# Patient Record
Sex: Female | Born: 1965 | Race: White | Hispanic: No | Marital: Married | Smoking: Never smoker
Health system: Southern US, Community
[De-identification: ages and names within clinical notes are randomized; demographics above are authoritative.]

## PROBLEM LIST (undated history)

## (undated) DIAGNOSIS — E785 Hyperlipidemia, unspecified: Secondary | ICD-10-CM

## (undated) DIAGNOSIS — K2270 Barrett's esophagus without dysplasia: Secondary | ICD-10-CM

## (undated) DIAGNOSIS — M722 Plantar fascial fibromatosis: Secondary | ICD-10-CM

## (undated) DIAGNOSIS — K219 Gastro-esophageal reflux disease without esophagitis: Secondary | ICD-10-CM

## (undated) DIAGNOSIS — J309 Allergic rhinitis, unspecified: Secondary | ICD-10-CM

## (undated) DIAGNOSIS — E282 Polycystic ovarian syndrome: Secondary | ICD-10-CM

## (undated) DIAGNOSIS — G4730 Sleep apnea, unspecified: Secondary | ICD-10-CM

## (undated) DIAGNOSIS — E559 Vitamin D deficiency, unspecified: Secondary | ICD-10-CM

---

## 1998-07-13 ENCOUNTER — Other Ambulatory Visit: Admission: RE | Admit: 1998-07-13 | Discharge: 1998-07-13 | Payer: Self-pay | Admitting: Gynecology

## 1998-07-19 ENCOUNTER — Other Ambulatory Visit: Admission: RE | Admit: 1998-07-19 | Discharge: 1998-07-19 | Payer: Self-pay | Admitting: Gynecology

## 1998-07-26 ENCOUNTER — Other Ambulatory Visit: Admission: RE | Admit: 1998-07-26 | Discharge: 1998-07-26 | Payer: Self-pay | Admitting: Obstetrics and Gynecology

## 1998-08-11 ENCOUNTER — Ambulatory Visit (HOSPITAL_COMMUNITY): Admission: RE | Admit: 1998-08-11 | Discharge: 1998-08-11 | Payer: Self-pay | Admitting: Obstetrics and Gynecology

## 1999-06-14 ENCOUNTER — Other Ambulatory Visit: Admission: RE | Admit: 1999-06-14 | Discharge: 1999-06-14 | Payer: Self-pay | Admitting: Obstetrics and Gynecology

## 1999-10-13 ENCOUNTER — Other Ambulatory Visit: Admission: RE | Admit: 1999-10-13 | Discharge: 1999-10-13 | Payer: Self-pay | Admitting: Obstetrics and Gynecology

## 2000-01-16 ENCOUNTER — Inpatient Hospital Stay (HOSPITAL_COMMUNITY): Admission: AD | Admit: 2000-01-16 | Discharge: 2000-01-16 | Payer: Self-pay | Admitting: Obstetrics and Gynecology

## 2000-01-16 ENCOUNTER — Inpatient Hospital Stay (HOSPITAL_COMMUNITY): Admission: AD | Admit: 2000-01-16 | Discharge: 2000-01-18 | Payer: Self-pay | Admitting: Obstetrics & Gynecology

## 2000-01-21 ENCOUNTER — Encounter: Admission: RE | Admit: 2000-01-21 | Discharge: 2000-04-20 | Payer: Self-pay | Admitting: Obstetrics and Gynecology

## 2000-03-12 ENCOUNTER — Other Ambulatory Visit: Admission: RE | Admit: 2000-03-12 | Discharge: 2000-03-12 | Payer: Self-pay | Admitting: Obstetrics and Gynecology

## 2000-03-13 ENCOUNTER — Other Ambulatory Visit: Admission: RE | Admit: 2000-03-13 | Discharge: 2000-03-13 | Payer: Self-pay | Admitting: Obstetrics and Gynecology

## 2000-03-13 ENCOUNTER — Encounter (INDEPENDENT_AMBULATORY_CARE_PROVIDER_SITE_OTHER): Payer: Self-pay

## 2000-08-01 ENCOUNTER — Other Ambulatory Visit: Admission: RE | Admit: 2000-08-01 | Discharge: 2000-08-01 | Payer: Self-pay | Admitting: Obstetrics and Gynecology

## 2001-02-20 ENCOUNTER — Other Ambulatory Visit: Admission: RE | Admit: 2001-02-20 | Discharge: 2001-02-20 | Payer: Self-pay | Admitting: Obstetrics and Gynecology

## 2001-10-01 ENCOUNTER — Other Ambulatory Visit: Admission: RE | Admit: 2001-10-01 | Discharge: 2001-10-01 | Payer: Self-pay | Admitting: Obstetrics and Gynecology

## 2002-07-29 ENCOUNTER — Encounter: Payer: Self-pay | Admitting: Obstetrics and Gynecology

## 2002-07-29 ENCOUNTER — Ambulatory Visit (HOSPITAL_COMMUNITY): Admission: RE | Admit: 2002-07-29 | Discharge: 2002-07-29 | Payer: Self-pay | Admitting: Obstetrics and Gynecology

## 2002-12-29 ENCOUNTER — Inpatient Hospital Stay (HOSPITAL_COMMUNITY): Admission: AD | Admit: 2002-12-29 | Discharge: 2002-12-31 | Payer: Self-pay | Admitting: Obstetrics and Gynecology

## 2003-02-18 ENCOUNTER — Other Ambulatory Visit: Admission: RE | Admit: 2003-02-18 | Discharge: 2003-02-18 | Payer: Self-pay | Admitting: *Deleted

## 2004-03-08 ENCOUNTER — Other Ambulatory Visit: Admission: RE | Admit: 2004-03-08 | Discharge: 2004-03-08 | Payer: Self-pay | Admitting: Obstetrics and Gynecology

## 2005-02-07 ENCOUNTER — Ambulatory Visit (HOSPITAL_COMMUNITY): Admission: RE | Admit: 2005-02-07 | Discharge: 2005-02-07 | Payer: Self-pay | Admitting: Obstetrics and Gynecology

## 2005-03-12 ENCOUNTER — Other Ambulatory Visit: Admission: RE | Admit: 2005-03-12 | Discharge: 2005-03-12 | Payer: Self-pay | Admitting: Obstetrics and Gynecology

## 2006-02-12 ENCOUNTER — Ambulatory Visit (HOSPITAL_COMMUNITY): Admission: RE | Admit: 2006-02-12 | Discharge: 2006-02-12 | Payer: Self-pay | Admitting: Obstetrics and Gynecology

## 2006-05-09 ENCOUNTER — Other Ambulatory Visit: Admission: RE | Admit: 2006-05-09 | Discharge: 2006-05-09 | Payer: Self-pay | Admitting: Obstetrics and Gynecology

## 2007-02-18 ENCOUNTER — Ambulatory Visit (HOSPITAL_COMMUNITY): Admission: RE | Admit: 2007-02-18 | Discharge: 2007-02-18 | Payer: Self-pay | Admitting: Obstetrics and Gynecology

## 2007-03-05 ENCOUNTER — Encounter: Admission: RE | Admit: 2007-03-05 | Discharge: 2007-03-05 | Payer: Self-pay | Admitting: Obstetrics and Gynecology

## 2008-03-10 ENCOUNTER — Ambulatory Visit (HOSPITAL_COMMUNITY): Admission: RE | Admit: 2008-03-10 | Discharge: 2008-03-10 | Payer: Self-pay | Admitting: Obstetrics and Gynecology

## 2009-03-11 ENCOUNTER — Ambulatory Visit (HOSPITAL_COMMUNITY): Admission: RE | Admit: 2009-03-11 | Discharge: 2009-03-11 | Payer: Self-pay | Admitting: Obstetrics and Gynecology

## 2010-03-22 ENCOUNTER — Ambulatory Visit (HOSPITAL_COMMUNITY): Admission: RE | Admit: 2010-03-22 | Discharge: 2010-03-22 | Payer: Self-pay | Admitting: Obstetrics and Gynecology

## 2010-03-27 ENCOUNTER — Encounter: Admission: RE | Admit: 2010-03-27 | Discharge: 2010-03-27 | Payer: Self-pay | Admitting: Obstetrics and Gynecology

## 2011-02-27 ENCOUNTER — Other Ambulatory Visit (HOSPITAL_COMMUNITY): Payer: Self-pay | Admitting: Obstetrics and Gynecology

## 2011-02-27 DIAGNOSIS — Z1231 Encounter for screening mammogram for malignant neoplasm of breast: Secondary | ICD-10-CM

## 2011-04-05 ENCOUNTER — Ambulatory Visit (HOSPITAL_COMMUNITY)
Admission: RE | Admit: 2011-04-05 | Discharge: 2011-04-05 | Disposition: A | Discharge status: Home / Self Care | Payer: PRIVATE HEALTH INSURANCE | Source: Ambulatory Visit | Attending: Obstetrics and Gynecology | Admitting: Obstetrics and Gynecology

## 2011-04-05 DIAGNOSIS — Z1231 Encounter for screening mammogram for malignant neoplasm of breast: Secondary | ICD-10-CM | POA: Insufficient documentation

## 2011-04-06 ENCOUNTER — Other Ambulatory Visit: Payer: Self-pay | Admitting: Obstetrics and Gynecology

## 2011-04-06 DIAGNOSIS — R928 Other abnormal and inconclusive findings on diagnostic imaging of breast: Secondary | ICD-10-CM

## 2011-04-12 ENCOUNTER — Ambulatory Visit
Admission: RE | Admit: 2011-04-12 | Discharge: 2011-04-12 | Disposition: A | Discharge status: Home / Self Care | Payer: PRIVATE HEALTH INSURANCE | Source: Ambulatory Visit | Attending: Obstetrics and Gynecology | Admitting: Obstetrics and Gynecology

## 2011-04-12 DIAGNOSIS — R928 Other abnormal and inconclusive findings on diagnostic imaging of breast: Secondary | ICD-10-CM

## 2011-05-11 NOTE — H&P (Signed)
Cynthia Mills, Cynthia Mills                        ACCOUNT NO.:  [id number]   MEDICAL RECORD NO.:  [id number]                   PATIENT TYPE:  INP   LOCATION:  9176                                 FACILITY:  [facility]   PHYSICIAN:  Vicki L. Emilee Hero, C.N.M.             DATE OF BIRTH:  1966-01-17   DATE OF ADMISSION:  12/29/2002  DATE OF DISCHARGE:                                HISTORY & PHYSICAL   HISTORY OF PRESENT ILLNESS:  Cynthia Mills is a 45 year old Gravida III, Para  II, 0/0/2 at 98 and 3/7th weeks who presents from the office with cervix of  6-7 cm in minimal labor. Reports positive fetal movement. She has had  prodromal labor over the last several days. Pregnancy has been remarkable  for advanced maternal age without amnio, history of infertility, history of  positive group B strep with previous pregnancy but negative this pregnancy.   PRENATAL LABORATORY DATA:  Blood type if B+, Rh antibody negative, VDRL non-  reactive. Rubella titer is immune. Hepatitis B surface antigen is negative.  HIV non-reactive. Cystic fibrosis test was negative. GC and Chlamydia  cultures were declined. PAP was normal in October. Glucose challenge was  normal. AFP was normal. Hemoglobin upon entry was 13.3. It was 11.7 at 27  weeks. Estimated date of confinement of December 26, 2002 was established by  last menstrual period and was in agreement with ultrasound at approximately  11 and 18 weeks. Group B strep culture was negative at 36 weeks.   HISTORY OF PRESENT PREGNANCY:  The patient entered care at approximately 9-  10 weeks. She had an early ultrasound for positive viability at  approximately 8 weeks. She declined amnio. She had an ultrasound at 19 weeks  that showed normal growth and development. She had a normal Glucola. The  rest of her pregnancy was essentially uncomplicated.   OBSTETRICAL HISTORY:  In 1997, the patient had a vaginal birth of a female  infant, weighing 7 pounds 3 ounce at 40 weeks. She  was in labor for 20  hours. She had no anesthesia. In January of 2001, she had a vaginal birth of  a female infant weighing 8 pounds and 3 ounces at 40 and 2/7th weeks. She was  in labor five hours. She had no complications. She did have positive beta  strep and was treated.   PAST MEDICAL HISTORY:  She was on oral contraceptives in the past prior to  infertility diagnosis. She stopped in 1995, at which time she began to go  through infertility issues and she has had no contraception since. She was  diagnosed with PCOS. Was treated by Dr. Chevis Pretty. She also had an HSG. She had  occasional yeast infection. She has ascus times two on PAP. In 2001  colposcopy and HPV were negative. She has a history of spastic colon as a  teenager but no problems now. At age 89 years, she had  her bladder  stretched. Her only other hospitalization was for childbirth times two.   ALLERGIES:  No known drug allergies.   FAMILY HISTORY:  Father and maternal grandmother have chronic hypertension.  Her grandmother had some type of problem and had spontaneous miscarriages.  Her grandfather had Alzheimer's. Maternal aunt had lupus and her grandmother  had breast cancer. Genetic history is remarkable only for the patient's age  of 65 to 42.   SOCIAL HISTORY:  The patient is married to the father of the baby. He is  involved and supportive. His name is Emeli Goguen. The patient is Caucasian  and of the Catholic faith. She has been followed by the Midwife Service and  [facility]. She denies any alcohol, drug, or tobacco use during this  pregnancy. 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 Her husband is a Clinical research associate.   PHYSICAL EXAMINATION:  VITAL SIGNS: Stable. Afebrile.  HEENT: Within normal limits.  LUNGS: Clear.  HEART: Regular rate and rhythm. No murmur, rub, or gallop.  BREASTS: Soft and nontender.  ABDOMEN: Fundal height 37 to 38 cm. Estimated fetal weight is 7 to  7.5  pounds. The uterine contractions are irregular and mild. Cervical  examination 6-7 cm, 80% vertex, and minus one station. Fetal heart rate is  reactive on intermittent tracing with no decelerations. Uterine contractions  are currently irregular.  EXTREMITIES: Deep tendon reflexes are 2+ without clonus. There is trace  edema noted.   IMPRESSION:  1. Intrauterine pregnancy at 40 and 3/7th weeks.  2. Advanced cervical change.  3. Post dates.   PLAN:  1. Admit to birthing suite with consult with Dr. Su Hilt, attending     physician.  2. Routine certified nurse midwife orders.  3. The patient declines pain medication. Will ambulate as much as possible.                                               Renaldo Reel Emilee Hero, C.N.M.    VLL/MEDQ  D:  12/29/2002  T:  12/29/2002  Job:  [id number]

## 2012-03-10 ENCOUNTER — Other Ambulatory Visit: Payer: Self-pay | Admitting: Obstetrics and Gynecology

## 2012-03-10 DIAGNOSIS — Z1231 Encounter for screening mammogram for malignant neoplasm of breast: Secondary | ICD-10-CM

## 2012-04-09 ENCOUNTER — Ambulatory Visit (HOSPITAL_COMMUNITY)
Admission: RE | Admit: 2012-04-09 | Discharge: 2012-04-09 | Disposition: A | Discharge status: Home / Self Care | Payer: PRIVATE HEALTH INSURANCE | Source: Ambulatory Visit | Attending: Obstetrics and Gynecology | Admitting: Obstetrics and Gynecology

## 2012-04-09 DIAGNOSIS — Z1231 Encounter for screening mammogram for malignant neoplasm of breast: Secondary | ICD-10-CM | POA: Insufficient documentation

## 2012-05-27 ENCOUNTER — Encounter: Payer: Self-pay | Admitting: Obstetrics and Gynecology

## 2013-03-09 ENCOUNTER — Other Ambulatory Visit: Payer: Self-pay | Admitting: Obstetrics and Gynecology

## 2013-04-20 ENCOUNTER — Ambulatory Visit (HOSPITAL_COMMUNITY)
Admission: RE | Admit: 2013-04-20 | Discharge: 2013-04-20 | Disposition: A | Discharge status: Home / Self Care | Payer: PRIVATE HEALTH INSURANCE | Source: Ambulatory Visit | Attending: Obstetrics and Gynecology | Admitting: Obstetrics and Gynecology

## 2013-04-20 DIAGNOSIS — Z1231 Encounter for screening mammogram for malignant neoplasm of breast: Secondary | ICD-10-CM | POA: Insufficient documentation

## 2014-04-01 ENCOUNTER — Other Ambulatory Visit: Payer: Self-pay | Admitting: Obstetrics and Gynecology

## 2014-04-01 DIAGNOSIS — Z1231 Encounter for screening mammogram for malignant neoplasm of breast: Secondary | ICD-10-CM

## 2014-05-03 ENCOUNTER — Ambulatory Visit (HOSPITAL_COMMUNITY): Payer: PRIVATE HEALTH INSURANCE

## 2014-05-07 ENCOUNTER — Ambulatory Visit (HOSPITAL_COMMUNITY): Payer: PRIVATE HEALTH INSURANCE

## 2014-05-12 ENCOUNTER — Ambulatory Visit (HOSPITAL_COMMUNITY)
Admission: RE | Admit: 2014-05-12 | Discharge: 2014-05-12 | Disposition: A | Discharge status: Home / Self Care | Payer: PRIVATE HEALTH INSURANCE | Source: Ambulatory Visit | Attending: Obstetrics and Gynecology | Admitting: Obstetrics and Gynecology

## 2014-05-12 DIAGNOSIS — Z1231 Encounter for screening mammogram for malignant neoplasm of breast: Secondary | ICD-10-CM | POA: Insufficient documentation

## 2015-03-03 ENCOUNTER — Ambulatory Visit (INDEPENDENT_AMBULATORY_CARE_PROVIDER_SITE_OTHER): Payer: BLUE CROSS/BLUE SHIELD | Admitting: Podiatry

## 2015-03-03 ENCOUNTER — Ambulatory Visit (INDEPENDENT_AMBULATORY_CARE_PROVIDER_SITE_OTHER): Payer: Self-pay

## 2015-03-03 VITALS — BP 119/82 | HR 80 | Resp 12

## 2015-03-03 DIAGNOSIS — R52 Pain, unspecified: Secondary | ICD-10-CM

## 2015-03-03 DIAGNOSIS — M8560 Other cyst of bone, unspecified site: Secondary | ICD-10-CM

## 2015-03-03 DIAGNOSIS — M79673 Pain in unspecified foot: Secondary | ICD-10-CM | POA: Diagnosis not present

## 2015-03-03 DIAGNOSIS — M722 Plantar fascial fibromatosis: Secondary | ICD-10-CM | POA: Diagnosis not present

## 2015-03-03 MED ORDER — MELOXICAM 7.5 MG PO TABS: 7.5000 mg | ORAL_TABLET | Freq: Every day | ORAL | Status: DC

## 2015-03-03 NOTE — Progress Notes (Signed)
Subjective:    Patient ID: Cynthia Mills, female    DOB: 1966-02-04, 49 y.o.   MRN: [id number]  HPI [name] presents the office today with complaints of bilateral heel pain with a right greater than left. She states that initially proximal be 67 years ago she was seen Dr. Paulla Dolly for bilateral heel pain. At that time she had multiple conservative treatments including injections, anti-inflammatories, orthotics, stretching, icing and ultimate she had shockwave therapy. She states that she had relief of symptoms for a couple years however she has started to have reoccurrence of symptoms about 6 months ago. She states the last 3 months has been more significant. She states that she has pain in the mornings or after periods of rest which is relieved by ambulation. She denies any tingling or numbness. The pain does not wake her up at night. Since the reoccurrence she started to stretching, icing, night splint without much resolution. She denies any history of injury or trauma to the area or any change or increase in activity the time of onset of symptoms. Denies any swelling or redness overlying the area. No other complaints at this time.  Review of Systems  All other systems reviewed and are negative.      Objective:   Physical Exam AAO x3, NAD DP/PT pulses palpable bilaterally, CRT less than 3 seconds Protective sensation intact with Simms Weinstein monofilament, vibratory sensation intact, Achilles tendon reflex intact Tenderness to palpation overlying the plantar medial tubercle of the calcaneus to bilateral heels at the insertion of the plantar fascia. The right is worse than the left. There is no pain along the course of plantar fascial within the arch of the foot and the plantar fascia appears intact. There is no pain with lateral compression of the calcaneus or pain the vibratory sensation bilaterally. No pain on the posterior aspect of the calcaneus or along the course/insertion of the  Achilles tendon. There is no overlying edema, erythema, increase in warmth. No other areas of tenderness palpation or pain with vibratory sensation to the foot/ankle bilaterally. MMT 5/5, ROM WNL No open lesions or pre-ulcerative lesions are identified. No pain with calf compression, swelling, warmth, erythema.      Assessment & Plan:  49 year old female with bilateral heel pain, likely plantar fasciitis; calcaneal cyst right side -X-rays were obtained and reviewed with the patient. On the right calcaneus or the large cyst within the calcaneus. There are no old x-rays to compare at this time. We have called to see if we can her her old chart to look at the previous x-rays.  -Discussed likely etiology of her symptoms. I do believe that she has plantar fasciitis symptoms however given the large cyst within the calcaneus on the right side this could also be a result of why the right side is significant more painful than the left. Due to the cyst I recommended an MRI to further evaluate the area. When workup and steroid injections at this time. She has a cam walker at home which she is also use previously for this issue. Diabetic returning her to use this on the right side until we obtain the results of the MRI -For the left discussed a steroid injection however she wishes to hold off at this time as the symptoms are mild. Discussed to continue stretching, icing. Dispensed plantar fascial brace. -Prescribed meloxicam. Discussed side effects the medication directed to stop immediately should any occur and call the office. -Follow-up after MRI. In the meantime, encouraged to  call the office with any questions/concerns/change in symptoms.

## 2015-03-04 NOTE — Addendum Note (Signed)
Addended by: Elta Guadeloupe on: 03/04/2015 08:26 AM   Modules accepted: Orders

## 2015-03-18 ENCOUNTER — Ambulatory Visit: Payer: Self-pay | Admitting: Podiatry

## 2015-03-24 ENCOUNTER — Encounter: Payer: Self-pay | Admitting: Podiatry

## 2015-03-29 ENCOUNTER — Encounter: Payer: Self-pay | Admitting: Podiatry

## 2015-03-29 ENCOUNTER — Ambulatory Visit (INDEPENDENT_AMBULATORY_CARE_PROVIDER_SITE_OTHER): Payer: BLUE CROSS/BLUE SHIELD | Admitting: Podiatry

## 2015-03-29 VITALS — BP 136/87 | HR 78 | Resp 16 | Ht 63.0 in | Wt 220.0 lb

## 2015-03-29 DIAGNOSIS — M8560 Other cyst of bone, unspecified site: Secondary | ICD-10-CM

## 2015-03-29 DIAGNOSIS — M722 Plantar fascial fibromatosis: Secondary | ICD-10-CM

## 2015-03-29 MED ORDER — MELOXICAM 7.5 MG PO TABS: 7.5000 mg | ORAL_TABLET | Freq: Every day | ORAL | Status: DC

## 2015-03-30 NOTE — Progress Notes (Signed)
Patient ID: Cynthia Mills, female   DOB: 11-Aug-1966, 49 y.o.   MRN: [id number]  Subjective: 49 year old female returns the office he discuss MRI results and for continued evaluation of bilateral heel pain. She states that she continues have pain intermittently and she has good days and bad days. She is continuing with the stretching icing activities. She wore the boot intermittently to the right side however she states that she does not like to wear. Denies any recent injury or trauma. No other complaints at this time.  Objective: AAO 3, NAD DP/PT pulses palpable, CRT less than 3 seconds Protective sensation intact with Simms Weinstein monofilament, vibratory sensation intact, Achilles tendon reflex intact. There is tenderness palpation overlying plantar medial tubercle calcaneus at the insertion of the plantar fascia bilaterally with a right greater than left. There is no pain on the course of plantar fascial in the arch of the foot in the ligament appears to be intact. There is no pain with lateral compression of the calcaneus or pain with vibratory sensation. No pain along the posterior aspect of the calcaneus or along the course/insertion the Achilles tendon. No other areas of tenderness to bilateral lower extremities. No overlying edema, erythema, increased warmth to bilateral lower extremities. MMT 5/5, ROM WNL No open lesions or pre-ulcer lesions identified bilaterally. No pain with calf compression, swelling, warmth, erythema  Assessment : 49 year old female with bilateral plantar fasciitis, right interosseous lipoma the calcaneus   Plan : -Treatment options were discussed the patient include alternatives, risks, complications.  -MRI results were discussed the patient.  -At this time patient wishes to hold off on steroid injection. Also due to cortical thinning in the intraosseous lipoma would recommend holding off on steroid injections if possible. -Will proceed with physical  therapy as she has attempted conservative treatment without any resolution other than injection therapy. If a prescription for physical therapy is given to the patient. -Follow-up as her PT evaluation of sooner if any problems are to arise. In the meantime occur should call the office with any questions, concerns, change in symptoms.

## 2015-04-11 ENCOUNTER — Encounter
Admit: 2015-04-11 | Disposition: A | Discharge status: Home / Self Care | Payer: Self-pay | Attending: Podiatry | Admitting: Podiatry

## 2015-04-27 ENCOUNTER — Ambulatory Visit: Payer: BLUE CROSS/BLUE SHIELD | Attending: Podiatry | Admitting: Physical Therapy

## 2015-04-27 ENCOUNTER — Encounter: Payer: Self-pay | Admitting: Physical Therapy

## 2015-04-27 DIAGNOSIS — M25673 Stiffness of unspecified ankle, not elsewhere classified: Secondary | ICD-10-CM | POA: Diagnosis not present

## 2015-04-27 DIAGNOSIS — M25672 Stiffness of left ankle, not elsewhere classified: Secondary | ICD-10-CM

## 2015-04-27 DIAGNOSIS — M722 Plantar fascial fibromatosis: Secondary | ICD-10-CM | POA: Diagnosis present

## 2015-04-27 DIAGNOSIS — R262 Difficulty in walking, not elsewhere classified: Secondary | ICD-10-CM | POA: Diagnosis not present

## 2015-04-27 DIAGNOSIS — M25671 Stiffness of right ankle, not elsewhere classified: Secondary | ICD-10-CM

## 2015-04-27 NOTE — Patient Instructions (Signed)
Pt issued new HEP, see objective.

## 2015-04-27 NOTE — Therapy (Signed)
[facility] [address] [place], [address] Phone: [phone]   Fax:  [phone]  Physical Therapy Treatment  Patient Details  Name: Cynthia Mills MRN: [id number] Date of Birth: [birth year] Referring Provider:  Trula Slade, DPM  Encounter Date: 04/27/2015      PT End of Session - 04/27/15 0832    Visit Number 4   Number of Visits 16   Date for PT Re-Evaluation 06/06/15   PT Start Time 0700   PT Stop Time 0730   PT Time Calculation (min) 30 min   Activity Tolerance No increased pain;Patient tolerated treatment well      History reviewed. No pertinent past medical history.  History reviewed. No pertinent past surgical history.  There were no vitals filed for this visit.  Visit Diagnosis:  Stiffness of ankle joint, left  Stiffness of ankle joint, right      Subjective Assessment - 04/27/15 0700    Subjective Pt reports incr. back stiffness. She used her strassburg sock for the first time this week and it has reduced pain upon waking. Incr. pain at night.    Currently in Pain? No/denies            [facility] PT Assessment - 04/27/15 0001    Assessment   Medical Diagnosis plantar fasciitis   Onset Date 12/24/14   Next MD Visit 06/06/2015   Prior Therapy no   Precautions   Precautions None   Balance Screen   Has the patient fallen in the past 6 months No   Has the patient had a decrease in activity level because of a fear of falling?  No   Is the patient reluctant to leave their home because of a fear of falling?  No      Objective:  Pt performed resisted ankle pumps to progressively load achilles tendon with GTB, 3x10 with knee flexed for soleus, 3x10 with knee straight for gastroc.  Pelvic tilts with cuing to relax back.  Pelvic tilts with heel slides 2x10.  Extensive cuing to be able to relax back, pt made improvement but unable to fully disengage back when  performing.  CPAs grade III 3x1 min L3-L5                       PT Education - 04/27/15 0700    Education provided Yes   Education Details HEP   Person(s) Educated Patient   Methods Explanation;Demonstration   Comprehension Verbalized understanding;Returned demonstration             PT Long Term Goals - 04/27/15 0836    PT LONG TERM GOAL #1   Title Pt will incr. hip extension confirmed by negative Ely test, to aid in incr. step length during ambulation.   Time 8   Period Weeks   Status On-going   PT LONG TERM GOAL #2   Title Pt will report decr. heel pain wiht gait to less than 1/10 to improve functional tolerance.   Time 8   Period Weeks   Status On-going   PT LONG TERM GOAL #3   Title Pt will be I with HEP for self management of ankle symptoms.   Time 8   Period Weeks   Status On-going               Plan - 04/27/15 2993    Clinical Impression Statement Pt appears to have made significant improvement in  acute plantar fasciitis, however back symptoms which are concurrent and appear to be contributing to pain have continued and pt demonstrates very poor compensation strategy using lumbar paraspinasl for core and pelvic floor.   Pt will benefit from skilled therapeutic intervention in order to improve on the following deficits Pain;Improper body mechanics;Postural dysfunction;Decreased strength;Difficulty walking   Rehab Potential Good   PT Frequency 2x / week   PT Duration 8 weeks   PT Treatment/Interventions Manual techniques;Therapeutic exercise;Gait training   PT Next Visit Plan progress to bridging with pelvic tilt, continue to treat plantar fasciitis pain as necessary.   Consulted and Agree with Plan of Care Patient        Problem List There are no active problems to display for this patient.   Fisher,Benjamin PT/DPT 04/27/2015, 8:39 AM  [facility] [address] [place], [address] Phone: [phone]   Fax:  [phone]

## 2015-05-02 ENCOUNTER — Other Ambulatory Visit (HOSPITAL_COMMUNITY): Payer: Self-pay | Admitting: Obstetrics and Gynecology

## 2015-05-02 DIAGNOSIS — Z1231 Encounter for screening mammogram for malignant neoplasm of breast: Secondary | ICD-10-CM

## 2015-05-05 ENCOUNTER — Encounter: Payer: PRIVATE HEALTH INSURANCE | Admitting: Physical Therapy

## 2015-05-05 ENCOUNTER — Ambulatory Visit: Payer: BLUE CROSS/BLUE SHIELD | Admitting: Physical Therapy

## 2015-05-09 ENCOUNTER — Encounter: Payer: Self-pay | Admitting: Physical Therapy

## 2015-05-09 ENCOUNTER — Ambulatory Visit: Payer: BLUE CROSS/BLUE SHIELD | Admitting: Physical Therapy

## 2015-05-09 DIAGNOSIS — M25672 Stiffness of left ankle, not elsewhere classified: Secondary | ICD-10-CM

## 2015-05-09 DIAGNOSIS — M722 Plantar fascial fibromatosis: Secondary | ICD-10-CM | POA: Diagnosis not present

## 2015-05-09 DIAGNOSIS — M25671 Stiffness of right ankle, not elsewhere classified: Secondary | ICD-10-CM

## 2015-05-09 NOTE — Therapy (Signed)
[facility] [address] [place], [address] Phone: [phone]   Fax:  [phone]  Physical Therapy Treatment  Patient Details  Name: Cynthia Mills MRN: [id number] Date of Birth: [birth year] Referring Provider:  Trula Slade, DPM  Encounter Date: 05/09/2015      PT End of Session - 05/09/15 1046    Visit Number 5   Number of Visits 16   Date for PT Re-Evaluation 06/06/15   PT Start Time 1000   PT Stop Time 3810   PT Time Calculation (min) 43 min   Activity Tolerance Patient tolerated treatment well;No increased pain   Behavior During Therapy [facility] for tasks assessed/performed      History reviewed. No pertinent past medical history.  History reviewed. No pertinent past surgical history.  There were no vitals filed for this visit.  Visit Diagnosis:  Stiffness of ankle joint, right  Stiffness of ankle joint, left      Subjective Assessment - 05/09/15 1004    Subjective (p) Pt reports she is having significant generalized pain. Pain in L knee, B ankles, pain in back. Pain is no greater than 3/10.   Currently in Pain? (p) Yes   Pain Score (p) 3    Pain Location (p) Ankle   Pain Orientation (p) Right           Objective:  Deep breathing to produce brace/stomach vacuum with manual palpation of ribs to improve upper abdominal activation.  Same with pelvic tilt 5 min.  Same with pelvic tilt and heel slides 3x5 on each side.  Instruction in self performance of psoas release with tennis ball including locating ASIS and use of wt to improve manual response.   STM performed on R peroneus longus, brevis. Following this pt reported significant improvement in lateral ankle pain, incr. Pain in plantar fascia.  Gastroc STM. No change in plantar fascia.  Psoas release on R following report of line of pain from back to groin to heel. Pt reports decr. Stiffness in back following  psoas release.  Following session reports incr. Ankle pain, decr. Back and knee pain.                          PT Long Term Goals - 04/27/15 0836    PT LONG TERM GOAL #1   Title Pt will incr. hip extension confirmed by negative Ely test, to aid in incr. step length during ambulation.   Time 8   Period Weeks   Status On-going   PT LONG TERM GOAL #2   Title Pt will report decr. heel pain wiht gait to less than 1/10 to improve functional tolerance.   Time 8   Period Weeks   Status On-going   PT LONG TERM GOAL #3   Title Pt will be I with HEP for self management of ankle symptoms.   Time 8   Period Weeks   Status On-going               Plan - 05/09/15 1048    Clinical Impression Statement In session improvement in back stiffness, lateral ankle pain. Incr. pain in plantar fascia following manual tx. Pt demonstrated improved ability to perform trunk control exercises with decr. back activation following cuing and breathing exercise.   Pt will benefit from skilled therapeutic intervention in order to improve on the following deficits Pain;Improper body mechanics;Postural dysfunction;Decreased strength;Difficulty walking  Rehab Potential Good   PT Frequency 2x / week   PT Duration 8 weeks   PT Treatment/Interventions Manual techniques;Therapeutic exercise;Gait training   Consulted and Agree with Plan of Care Patient        Problem List There are no active problems to display for this patient.   Xavier Fournier 05/09/2015, 10:57 AM  [facility] [address], [place], [address] Phone: [phone]   Fax:  [phone]

## 2015-05-11 ENCOUNTER — Ambulatory Visit: Payer: BLUE CROSS/BLUE SHIELD | Admitting: Physical Therapy

## 2015-05-11 DIAGNOSIS — M25671 Stiffness of right ankle, not elsewhere classified: Secondary | ICD-10-CM

## 2015-05-11 DIAGNOSIS — M25672 Stiffness of left ankle, not elsewhere classified: Secondary | ICD-10-CM

## 2015-05-11 DIAGNOSIS — M722 Plantar fascial fibromatosis: Secondary | ICD-10-CM | POA: Diagnosis not present

## 2015-05-11 NOTE — Therapy (Signed)
[facility] [address], [place], [address] Phone: [phone]   Fax:  [phone]  Physical Therapy Treatment  Patient Details  Name: Cynthia Mills MRN: [id number] Date of Birth: [birth year] Referring Provider:  Trula Slade, DPM  Encounter Date: 05/11/2015      PT End of Session - 05/11/15 1201    Visit Number 6   Number of Visits 16   Date for PT Re-Evaluation 06/06/15   PT Start Time 1829   PT Stop Time 1130   PT Time Calculation (min) 45 min   Activity Tolerance Patient tolerated treatment well;No increased pain   Behavior During Therapy [facility] for tasks assessed/performed      No past medical history on file.  No past surgical history on file.  There were no vitals filed for this visit.  Visit Diagnosis:  Stiffness of ankle joint, left  Stiffness of ankle joint, right      Subjective Assessment - 05/11/15 1159    Subjective Pt reports no change since previous session. She has not been doing her exercises as she is feeling down and is having difficulty being motivated.    Currently in Pain? No/denies              Objective: Lunge variations focusing on maintaining knee flexion (avoiding locking knees), with and without turning to focus on avoiding knee valgus to address motor pattern deficit.  3x15 heel raises on stairs.  5x20' narrow BOS walking with heel raises - incr. Knee pain.  Patellar mobs 3x1 min med/lat, 3x1 min inferior glides  3x1 min tib/femur mobs PA grade III  IASTM for B plantar fascia.  Following manual tx pt reported incr. Tenderness in plantar fascia, improved ability to perform BOS walking with decr. Knee pain, and overall decr. Pain with gait.                   PT Education - 05/11/15 1200    Education provided Yes   Education Details Educated pt on postural modification, HEP, maintaining knee flexion with standing tasks.    Person(s) Educated Patient   Methods Explanation;Demonstration   Comprehension Verbalized understanding;Returned demonstration             PT Long Term Goals - 04/27/15 0836    PT LONG TERM GOAL #1   Title Pt will incr. hip extension confirmed by negative Ely test, to aid in incr. step length during ambulation.   Time 8   Period Weeks   Status On-going   PT LONG TERM GOAL #2   Title Pt will report decr. heel pain wiht gait to less than 1/10 to improve functional tolerance.   Time 8   Period Weeks   Status On-going   PT LONG TERM GOAL #3   Title Pt will be I with HEP for self management of ankle symptoms.   Time 8   Period Weeks   Status On-going               Plan - 05/11/15 1202    Clinical Impression Statement Pt had significant improvement in L knee pain with functional activities, decr. back stiffness. Plantar fascia did not hurt but pt reported feeling a thickening of the area. Pt is continuing to have mild to moderate difficulty with B plantar fascia pain in the morning though overlal is doing much better. Continues to have whole body coordination/motor planning deficits.   Pt will benefit  from skilled therapeutic intervention in order to improve on the following deficits Pain;Improper body mechanics;Postural dysfunction;Decreased strength;Difficulty walking   Rehab Potential Good   PT Frequency 2x / week   PT Duration 8 weeks   PT Treatment/Interventions Manual techniques;Therapeutic exercise;Gait training   PT Next Visit Plan progress to bridging with pelvic tilt, continue to treat plantar fasciitis pain as necessary.   Consulted and Agree with Plan of Care Patient        Problem List There are no active problems to display for this patient.   Zymire Turnbo 05/11/2015, 12:05 PM  [facility] [address] [place], [address] Phone: [phone]   Fax:  [phone]

## 2015-05-16 ENCOUNTER — Ambulatory Visit (HOSPITAL_COMMUNITY)
Admission: RE | Admit: 2015-05-16 | Discharge: 2015-05-16 | Disposition: A | Discharge status: Home / Self Care | Payer: BLUE CROSS/BLUE SHIELD | Source: Ambulatory Visit | Attending: Obstetrics and Gynecology | Admitting: Obstetrics and Gynecology

## 2015-05-16 ENCOUNTER — Ambulatory Visit: Payer: BLUE CROSS/BLUE SHIELD | Admitting: Physical Therapy

## 2015-05-16 DIAGNOSIS — M25672 Stiffness of left ankle, not elsewhere classified: Secondary | ICD-10-CM

## 2015-05-16 DIAGNOSIS — Z1231 Encounter for screening mammogram for malignant neoplasm of breast: Secondary | ICD-10-CM | POA: Insufficient documentation

## 2015-05-16 DIAGNOSIS — M722 Plantar fascial fibromatosis: Secondary | ICD-10-CM | POA: Diagnosis not present

## 2015-05-16 DIAGNOSIS — M25671 Stiffness of right ankle, not elsewhere classified: Secondary | ICD-10-CM

## 2015-05-16 NOTE — Therapy (Signed)
[facility] [address] [place], [address] Phone: [phone]   Fax:  [phone]  Physical Therapy Treatment  Patient Details  Name: Cynthia Mills MRN: [id number] Date of Birth: 1966-01-05 Referring Provider:  Trula Slade, DPM  Encounter Date: 05/16/2015      PT End of Session - 05/16/15 1659    Visit Number 7   Number of Visits 16   Date for PT Re-Evaluation 06/06/15   PT Start Time 1600   PT Stop Time 1640   PT Time Calculation (min) 40 min   Activity Tolerance Patient tolerated treatment well;No increased pain   Behavior During Therapy [facility] for tasks assessed/performed      No past medical history on file.  No past surgical history on file.  There were no vitals filed for this visit.  Visit Diagnosis:  Stiffness of ankle joint, left  Stiffness of ankle joint, right      Subjective Assessment - 05/16/15 1659    Subjective Pt reports she feels she has plateau'd. Ankles are "sore" as is back and L knee. NO pain per se currently.   Currently in Pain? No/denies          Objective: Trigger point dry needling performed on B quadratus plantae, flexor digitorum, with piston and twist technique.  IASTM and STM on same muscles, improved tolerance for this following trigger point dry needling.   Reviewed HEP and encouraged pt to focus on plantar loading.                           PT Long Term Goals - 04/27/15 0836    PT LONG TERM GOAL #1   Title Pt will incr. hip extension confirmed by negative Ely test, to aid in incr. step length during ambulation.   Time 8   Period Weeks   Status On-going   PT LONG TERM GOAL #2   Title Pt will report decr. heel pain wiht gait to less than 1/10 to improve functional tolerance.   Time 8   Period Weeks   Status On-going   PT LONG TERM GOAL #3   Title Pt will be I with HEP for self management of ankle symptoms.    Time 8   Period Weeks   Status On-going               Plan - 05/16/15 1700    Clinical Impression Statement focus of session on addressing continued "soreness" in B heels. Pt clarified that when she refers to soreness she is really referring to mild pain so we focused on this. Continues to have mild pain in the mornings, improved from start of care but still serious.   Pt will benefit from skilled therapeutic intervention in order to improve on the following deficits Pain;Improper body mechanics;Postural dysfunction;Decreased strength;Difficulty walking        Problem List There are no active problems to display for this patient.   Fisher,Benjamin 05/16/2015, 5:06 PM  [facility] [address], [place], [address] Phone: [phone]   Fax:  [phone]

## 2015-05-18 ENCOUNTER — Other Ambulatory Visit: Payer: Self-pay | Admitting: Obstetrics and Gynecology

## 2015-05-18 DIAGNOSIS — R928 Other abnormal and inconclusive findings on diagnostic imaging of breast: Secondary | ICD-10-CM

## 2015-05-19 ENCOUNTER — Ambulatory Visit: Payer: BLUE CROSS/BLUE SHIELD | Admitting: Physical Therapy

## 2015-05-19 DIAGNOSIS — M25671 Stiffness of right ankle, not elsewhere classified: Secondary | ICD-10-CM

## 2015-05-19 DIAGNOSIS — M25672 Stiffness of left ankle, not elsewhere classified: Secondary | ICD-10-CM

## 2015-05-19 DIAGNOSIS — M722 Plantar fascial fibromatosis: Secondary | ICD-10-CM | POA: Diagnosis not present

## 2015-05-19 NOTE — Therapy (Signed)
[facility] [address] [place], [address] Phone: [phone]   Fax:  [phone]  Physical Therapy Treatment  Patient Details  Name: Cynthia Mills MRN: [id number] Date of Birth: 11/20/1966 Referring Provider:  Trula Slade, DPM  Encounter Date: 05/19/2015      PT End of Session - 05/19/15 0943    Visit Number 8   Number of Visits 16   Date for PT Re-Evaluation 06/06/15   PT Start Time 0900   PT Stop Time 0942   PT Time Calculation (min) 42 min   Activity Tolerance Patient tolerated treatment well;No increased pain   Behavior During Therapy [facility] for tasks assessed/performed      No past medical history on file.  No past surgical history on file.  There were no vitals filed for this visit.  Visit Diagnosis:  Stiffness of ankle joint, left  Stiffness of ankle joint, right      Subjective Assessment - 05/19/15 0941    Subjective Pt reports no pain today, she is doing much better. Incr. L knee pain when performing her exercises. only pain currently is upon waking.   Currently in Pain? No/denies                Objective: Trigger point dry needling performed on B QP, FDP. On L noted incr. Pain with this, following pt reported no pain with first step. On R incr. "grabbing" of muscle, no change in pain. STM on B medial calcaneal tubercle.   Assessed knee, gentle STM on VMO/adductors.  Toe walking with knees bent to minimize pain in knee. Pt reports improvement in pain with this. Appears to be that pain in knee comes on with high level quad activation possibly compressing patella.  Following treatment today pt reported improvement in first step pain. Verbalized understanding of exercise modifications to minimize knee pain and first step pain.                 PT Education - 05/19/15 0941    Education Details Educated pt on assessing whether exercises are  incr. pain and to d/c exercises when painful.   Person(s) Educated Patient   Methods Explanation   Comprehension Verbalized understanding             PT Long Term Goals - 04/27/15 0836    PT LONG TERM GOAL #1   Title Pt will incr. hip extension confirmed by negative Ely test, to aid in incr. step length during ambulation.   Time 8   Period Weeks   Status On-going   PT LONG TERM GOAL #2   Title Pt will report decr. heel pain wiht gait to less than 1/10 to improve functional tolerance.   Time 8   Period Weeks   Status On-going   PT LONG TERM GOAL #3   Title Pt will be I with HEP for self management of ankle symptoms.   Time 8   Period Weeks   Status On-going               Plan - 05/19/15 1761    Clinical Impression Statement Pt made definite improvement in back soreness and B ankle pain following previous and this session. Continuing to focus on ankles and will communicate with pelvic floor PT regarding treatment of back issues. At this time primary pain is upon waking and with extended periods of walking.   Pt will benefit from skilled therapeutic intervention  in order to improve on the following deficits Pain;Improper body mechanics;Postural dysfunction;Decreased strength;Difficulty walking        Problem List There are no active problems to display for this patient.   Malinda Mayden 05/19/2015, 9:46 AM  [facility] [address], [place], [address] Phone: [phone]   Fax:  [phone]

## 2015-05-25 ENCOUNTER — Ambulatory Visit: Payer: BLUE CROSS/BLUE SHIELD | Attending: Podiatry | Admitting: Physical Therapy

## 2015-05-25 ENCOUNTER — Ambulatory Visit
Admission: RE | Admit: 2015-05-25 | Discharge: 2015-05-25 | Disposition: A | Discharge status: Home / Self Care | Payer: BLUE CROSS/BLUE SHIELD | Source: Ambulatory Visit | Attending: Obstetrics and Gynecology | Admitting: Obstetrics and Gynecology

## 2015-05-25 DIAGNOSIS — M2560 Stiffness of unspecified joint, not elsewhere classified: Secondary | ICD-10-CM | POA: Diagnosis present

## 2015-05-25 DIAGNOSIS — R928 Other abnormal and inconclusive findings on diagnostic imaging of breast: Secondary | ICD-10-CM

## 2015-05-25 DIAGNOSIS — M25672 Stiffness of left ankle, not elsewhere classified: Secondary | ICD-10-CM | POA: Diagnosis present

## 2015-05-25 DIAGNOSIS — M25671 Stiffness of right ankle, not elsewhere classified: Secondary | ICD-10-CM | POA: Insufficient documentation

## 2015-05-25 NOTE — Therapy (Signed)
[facility] [address], [place], [address] Phone: [phone]   Fax:  [phone]  Physical Therapy Treatment  Patient Details  Name: Cynthia Mills MRN: [id number] Date of Birth: 1966-11-15 Referring Provider:  Trula Slade, DPM  Encounter Date: 05/25/2015      PT End of Session - 05/25/15 1110    Visit Number 9   Number of Visits 16   Date for PT Re-Evaluation 06/06/15   PT Start Time 8341   PT Stop Time 1110   PT Time Calculation (min) 42 min   Activity Tolerance Patient tolerated treatment well;No increased pain   Behavior During Therapy [facility] for tasks assessed/performed      No past medical history on file.  No past surgical history on file.  There were no vitals filed for this visit.  Visit Diagnosis:  Stiffness of ankle joint, left  Stiffness of ankle joint, right      Subjective Assessment - 05/25/15 1031    Subjective Pt reports she was in flip flops over the weekend, overall she feels she is doing significantly better. She has stopped using her sock due to toe pain. This is causing mild pain to return in her heel.    Currently in Pain? No/denies             Objective: Extensive IASTM using Edge tool on B med/lat gastroc, achilles tendon. Following this pt reported incr. Soreness but 0/10 pain with first step.   Reviewed and had pt perform ankle stair stretch - educated pt on the importance of consistent performance of this stretch for continued improvement.  Assessed 2nd toe based on pt report of pain in 2nd toe with use of sock.  amb in clinic with and without cuing to correct ankle eversion, incr. Pain with correcting to neutral, noted whip when performing barefoot.  Able to correct for short periods of time, unable to maintain correction.                    PT Education - 05/25/15 1044    Education provided Yes   Education Details  educated pt on discontinuing sock use for the next few days              PT Long Term Goals - 04/27/15 0836    PT LONG TERM GOAL #1   Title Pt will incr. hip extension confirmed by negative Ely test, to aid in incr. step length during ambulation.   Time 8   Period Weeks   Status On-going   PT LONG TERM GOAL #2   Title Pt will report decr. heel pain wiht gait to less than 1/10 to improve functional tolerance.   Time 8   Period Weeks   Status On-going   PT LONG TERM GOAL #3   Title Pt will be I with HEP for self management of ankle symptoms.   Time 8   Period Weeks   Status On-going               Plan - 05/25/15 1111    Clinical Impression Statement first step pain in session improved to less than 2/10 for pt following tx. noted decr. antalgic gait following tx. Pt would benefit from continued PT to continue to decr. report of first step pain, stiffness with gait, related knee pain.   Pt will benefit from skilled therapeutic intervention in order to improve on the following deficits Pain;Improper  body mechanics;Postural dysfunction;Decreased strength;Difficulty walking   Rehab Potential Good   PT Frequency 2x / week   PT Duration 8 weeks   PT Treatment/Interventions Manual techniques;Therapeutic exercise;Gait training        Problem List There are no active problems to display for this patient.   Fisher,Benjamin 05/25/2015, 11:16 AM  [facility] [address] [place], [address] Phone: [phone]   Fax:  [phone]

## 2015-05-26 ENCOUNTER — Ambulatory Visit: Payer: BLUE CROSS/BLUE SHIELD | Admitting: Physical Therapy

## 2015-05-26 DIAGNOSIS — M25671 Stiffness of right ankle, not elsewhere classified: Secondary | ICD-10-CM

## 2015-05-26 DIAGNOSIS — M25672 Stiffness of left ankle, not elsewhere classified: Secondary | ICD-10-CM

## 2015-05-26 NOTE — Therapy (Signed)
[facility] [address], [place], [address] Phone: [phone]   Fax:  [phone]  Physical Therapy Treatment  Patient Details  Name: Cynthia Mills MRN: [id number] Date of Birth: 1966/05/30 Referring Provider:  Trula Slade, DPM  Encounter Date: 05/26/2015      PT End of Session - 05/26/15 1450    Visit Number 10   Number of Visits 16   Date for PT Re-Evaluation 06/06/15   PT Start Time 1400   PT Stop Time 1444   PT Time Calculation (min) 44 min   Activity Tolerance Patient tolerated treatment well;No increased pain   Behavior During Therapy [facility] for tasks assessed/performed      No past medical history on file.  No past surgical history on file.  There were no vitals filed for this visit.  Visit Diagnosis:  Stiffness of ankle joint, left  Stiffness of ankle joint, right      Subjective Assessment - 05/26/15 1407    Subjective Pt slept without [place] sock, had incr. first step pain due to this but no toe pain. Currently pain free, pt feels stiffness in back.   Currently in Pain? No/denies          Objective: Patellar mobs med/lat, inferior 3x1 min each on L knee. Tib/femur PAs same, grade III. STM performed on distal adductors.  Stairs performed 5x4 steps before and after knee tx. Prior to manual PT pt reported consistent 4/10 pain with knee flexion ascending, with first step knee flexion descending stairs. Following tx pain improved to 1/10.  Trigger point dry needling performed on R gastroc in lateral region two needles used, twist and pivot technique. Following this pt demonstrated improved performance of gait with decr. Antalgia noted.  PT educated pt on more strategies to remember not to lock out knees when standing for long periods of time.                       PT Education - 05/26/15 1444    Education provided Yes   Education Details PT  educated pt on self management of L knee pain with performance of HEP, modification of HEP to avoid walking and incr. stationary bike use.   Person(s) Educated Patient   Methods Explanation   Comprehension Verbalized understanding             PT Long Term Goals - 04/27/15 0836    PT LONG TERM GOAL #1   Title Pt will incr. hip extension confirmed by negative Ely test, to aid in incr. step length during ambulation.   Time 8   Period Weeks   Status On-going   PT LONG TERM GOAL #2   Title Pt will report decr. heel pain wiht gait to less than 1/10 to improve functional tolerance.   Time 8   Period Weeks   Status On-going   PT LONG TERM GOAL #3   Title Pt will be I with HEP for self management of ankle symptoms.   Time 8   Period Weeks   Status On-going               Plan - 05/26/15 1452    Clinical Impression Statement 0/10 knee pain with performance of HEP and with stairs following session. no pain but stiffness with first few steps following tx. Pt has been inconsistent with her HEP and continues to struggle with correcting posture which is contributing  to continuation of pain.   Pt will benefit from skilled therapeutic intervention in order to improve on the following deficits Pain;Improper body mechanics;Postural dysfunction;Decreased strength;Difficulty walking        Problem List There are no active problems to display for this patient.   Ingris Pasquarella 05/26/2015, 2:55 PM  [facility] [address] [place], [address] Phone: [phone]   Fax:  [phone]

## 2015-05-30 ENCOUNTER — Ambulatory Visit: Payer: BLUE CROSS/BLUE SHIELD | Admitting: Physical Therapy

## 2015-05-30 DIAGNOSIS — M25672 Stiffness of left ankle, not elsewhere classified: Secondary | ICD-10-CM

## 2015-05-30 DIAGNOSIS — M25671 Stiffness of right ankle, not elsewhere classified: Secondary | ICD-10-CM

## 2015-05-30 NOTE — Therapy (Signed)
[facility] [address], [place], [address] Phone: [phone]   Fax:  [phone]  Physical Therapy Treatment  Patient Details  Name: Cynthia Mills MRN: [id number] Date of Birth: 1966/07/24 Referring Provider:  Trula Slade, DPM  Encounter Date: 05/30/2015      PT End of Session - 05/30/15 0939    Visit Number 11   Number of Visits 16   Date for PT Re-Evaluation 06/06/15   PT Start Time 0900   PT Stop Time 0938   PT Time Calculation (min) 38 min   Activity Tolerance Patient tolerated treatment well;No increased pain   Behavior During Therapy [facility] for tasks assessed/performed      No past medical history on file.  No past surgical history on file.  There were no vitals filed for this visit.  Visit Diagnosis:  Stiffness of ankle joint, right  Stiffness of ankle joint, left      Subjective Assessment - 05/30/15 0937    Subjective Pt reports 0/10 pain now, 3/10 at start of day. She has been inconsistent with HEP due to getting ready for graduation.   Currently in Pain? No/denies                       Objective: Subtalar distraction 4x1 min. Navicular, cuboid, cuneiform mobs 3x1 min each grade III. PIP joints for first three rays 3x1 min grade III.  Following this assessed walking, pt able to achieve full heel to toe gait pain free even with first 3 steps. Deferred exercise but will focus on this at next session.               PT Long Term Goals - 04/27/15 0836    PT LONG TERM GOAL #1   Title Pt will incr. hip extension confirmed by negative Ely test, to aid in incr. step length during ambulation.   Time 8   Period Weeks   Status On-going   PT LONG TERM GOAL #2   Title Pt will report decr. heel pain wiht gait to less than 1/10 to improve functional tolerance.   Time 8   Period Weeks   Status On-going   PT LONG TERM GOAL #3   Title Pt will be  I with HEP for self management of ankle symptoms.   Time 8   Period Weeks   Status On-going               Plan - 05/30/15 0940    Clinical Impression Statement Pt has demonstrable hypmobility in B ankle/foot joints. Focus of session on addressing this as moblizations reproduced pain at grade IV. Following session pt reports decr. initial step compared with previous sessions where focus was on soft tissue dysfunction. Pt will be unable to continue with exercises this week due to home requirements but pt will continue following this weekend.        Problem List There are no active problems to display for this patient.   Chandni Gagan 05/30/2015, 9:43 AM  [place] [facility] [address] [place], [address] Phone: [phone]   Fax:  [phone]

## 2015-06-01 ENCOUNTER — Ambulatory Visit: Payer: BLUE CROSS/BLUE SHIELD | Admitting: Physical Therapy

## 2015-06-06 ENCOUNTER — Ambulatory Visit: Payer: BLUE CROSS/BLUE SHIELD | Admitting: Physical Therapy

## 2015-06-06 DIAGNOSIS — M25672 Stiffness of left ankle, not elsewhere classified: Secondary | ICD-10-CM | POA: Diagnosis not present

## 2015-06-06 DIAGNOSIS — M25671 Stiffness of right ankle, not elsewhere classified: Secondary | ICD-10-CM

## 2015-06-06 NOTE — Therapy (Signed)
[facility] [address] [place], [address] Phone: [phone]   Fax:  [phone]  Physical Therapy Treatment  Patient Details  Name: Cynthia Mills MRN: [id number] Date of Birth: Jul 06, 1966 Referring Provider:  Trula Slade, DPM  Encounter Date: 06/06/2015      PT End of Session - 06/06/15 1133    Visit Number 12   Number of Visits 16   PT Start Time 1030   PT Stop Time 1115   PT Time Calculation (min) 45 min   Activity Tolerance Patient tolerated treatment well;No increased pain   Behavior During Therapy [facility] for tasks assessed/performed      No past medical history on file.  No past surgical history on file.  There were no vitals filed for this visit.  Visit Diagnosis:  Stiffness of ankle joint, right  Stiffness of ankle joint, left      Subjective Assessment - 06/06/15 1031    Subjective Pt reports she has had a bad few days, she has been inconsistent with her HEP, has been wearing non-supportive shoes, and is having incr. ache and pain in R heel.   Pain Score 4    Pain Location Heel   Pain Orientation Right           Objective Extensive IASTM and STM performed on B plantar fascia focusing on medial calcaneal tubercle. Following this pt had decr. Antalgic gait, improvement in self reported pain to 0/10.  Standing oscillations into DF using pro-stretch 3x1 min.  Reviewed HEP re-issued wall squat and reverse lunge, stair stretch.  Pt verbalized and demonstrated understanding of exercises. Required re-education on performance of reverse lunge to avoid knee pain.                      PT Education - 06/06/15 1130    Education provided Yes   Education Details educated pt on HEP return to quad exercises of backwards lunge, squat against the wall.   Person(s) Educated Patient   Methods Explanation   Comprehension Verbalized understanding              PT Long Term Goals - 04/27/15 0836    PT LONG TERM GOAL #1   Title Pt will incr. hip extension confirmed by negative Ely test, to aid in incr. step length during ambulation.   Time 8   Period Weeks   Status On-going   PT LONG TERM GOAL #2   Title Pt will report decr. heel pain wiht gait to less than 1/10 to improve functional tolerance.   Time 8   Period Weeks   Status On-going   PT LONG TERM GOAL #3   Title Pt will be I with HEP for self management of ankle symptoms.   Time 8   Period Weeks   Status On-going               Plan - 06/06/15 1133    Clinical Impression Statement Incr. pain in B ankles due to lack of mobility exercises over the past few days. Pt would benefit from therapy to address this as well as return of pain. Pain is more clearly linked today to impaired DF length as at start of session pt only able to achieve 0 degrees DF.  Look to address gastroc tightness with exercise at next session.   Consulted and Agree with Plan of Care Patient  Problem List There are no active problems to display for this patient.   Fisher,Benjamin 06/06/2015, 11:35 AM  [facility] [address], [place], [address] Phone: [phone]   Fax:  [phone]

## 2015-06-08 ENCOUNTER — Ambulatory Visit: Payer: BLUE CROSS/BLUE SHIELD | Admitting: Physical Therapy

## 2015-06-08 DIAGNOSIS — M25671 Stiffness of right ankle, not elsewhere classified: Secondary | ICD-10-CM

## 2015-06-08 DIAGNOSIS — M25672 Stiffness of left ankle, not elsewhere classified: Secondary | ICD-10-CM | POA: Diagnosis not present

## 2015-06-08 NOTE — Therapy (Signed)
[facility] [address] [place], [address] Phone: [phone]   Fax:  [phone]  Physical Therapy Treatment  Patient Details  Name: Cynthia Mills MRN: [id number] Date of Birth: 1966-12-08 Referring Provider:  Trula Slade, DPM  Encounter Date: 06/08/2015      PT End of Session - 06/08/15 0958    Visit Number 13   Number of Visits 16   Date for PT Re-Evaluation 07/25/15   PT Start Time 0900   PT Stop Time 0945   PT Time Calculation (min) 45 min   Activity Tolerance Patient tolerated treatment well;No increased pain   Behavior During Therapy [facility] for tasks assessed/performed      No past medical history on file.  No past surgical history on file.  There were no vitals filed for this visit.  Visit Diagnosis:  Stiffness of ankle joint, right - Plan: PT plan of care cert/re-cert      Subjective Assessment - 06/08/15 0957    Subjective "I'm feeling better today, I have incr. tightness in my R calf but otherwise have had no morning pain."   Currently in Pain? No/denies         Objective: TG squat, heel raise, heel raise with bent knees. Pt required extensive cuing to avoid knee hyperextension with each of these. Noted incr. Pain with bent knee heel raise in R side indicating possible soleus dysfunction.  Following this assessed gait which was WNL with no pain except end range DF on R side.  Wall sit 3x10 - corrected posture so pt able to do so with no pain - to keep knees behind ankles.  Discontinued lunge due to incr. Pain.  Rocking forward and back to recognize activation of plantar flexors, quads and back. Initially pt reported with onset of tightness in plantar flexors she also felt back pain. PT cued pt to perform ADIM and to lift anterior pelvic floor and this relaxed back and decr. Sense of tightness in plantar flexors.  Trigger point dry needling on R soleus to  address difficulty with bent knee activity. Following this pt better able to maintain neutral knee posture with standing and gait.                        PT Education - 06/08/15 0957    Education provided Yes   Education Details postural education to avoid locking knees with functional activities of standing/wt shifting.   Person(s) Educated Patient   Methods Explanation   Comprehension Verbalized understanding             PT Long Term Goals - 06/08/15 1003    PT LONG TERM GOAL #1   Title Pt will incr. hip extension confirmed by negative Ely test, to aid in incr. step length during ambulation.   Time 8   Period Weeks   Status Deferred   PT LONG TERM GOAL #2   Title Pt will report decr. heel pain wiht gait to less than 1/10 to improve functional tolerance.   Time 8   Period Weeks   Status Achieved   PT LONG TERM GOAL #3   Title Pt will be I with HEP for self management of ankle symptoms.   Time 8   Period Weeks   Status Achieved   PT LONG TERM GOAL #4   Title Pt will be able to perform 10 squats pain free with no hyperextension in  knees to decr. irritating motor pattern and decr. LE pain.   Time 8   Period Weeks   Status New               Plan - 06/08/15 0086    Clinical Impression Statement Treatment appears to be effective in managing short term symptoms related to B plantar fasciitis. However pt does demonstrate complex orthopedic challenge with apparently related back pain and pelvic floor dysfunction. Pt's overall hypermobility appears to be contributing to frequent exacerbation of symptoms, pt reports decr. symptoms when she is able to maintain normal degree of extension in B knees with standing tasks. Pt would benefit from additional PT to maintain gains and progress LE strength, continue to decr. pain.   Pt will benefit from skilled therapeutic intervention in order to improve on the following deficits Pain;Improper body mechanics;Postural  dysfunction;Decreased strength;Difficulty walking   PT Frequency 2x / week   PT Duration 8 weeks   PT Treatment/Interventions Manual techniques;Therapeutic exercise;Gait training        Problem List There are no active problems to display for this patient.   Waverly Chavarria 06/08/2015, 10:07 AM  [facility] [address], [place], [address] Phone: [phone]   Fax:  [phone]

## 2015-06-13 ENCOUNTER — Ambulatory Visit: Payer: BLUE CROSS/BLUE SHIELD | Admitting: Physical Therapy

## 2015-06-13 ENCOUNTER — Encounter: Payer: Self-pay | Admitting: Physical Therapy

## 2015-06-13 DIAGNOSIS — M25671 Stiffness of right ankle, not elsewhere classified: Secondary | ICD-10-CM

## 2015-06-13 DIAGNOSIS — M25672 Stiffness of left ankle, not elsewhere classified: Secondary | ICD-10-CM

## 2015-06-13 NOTE — Therapy (Signed)
[facility] [address], [place], [address] Phone: [phone]   Fax:  [phone]  Physical Therapy Treatment  Patient Details  Name: Cynthia Mills MRN: [id number] Date of Birth: 10-Jan-1966 Referring Provider:  Trula Slade, DPM  Encounter Date: 06/13/2015      PT End of Session - 06/13/15 1124    Visit Number 15   Number of Visits 16   Date for PT Re-Evaluation 07/25/15   PT Start Time 0915   PT Stop Time 1000   PT Time Calculation (min) 45 min   Activity Tolerance Patient tolerated treatment well;No increased pain   Behavior During Therapy [facility] for tasks assessed/performed      History reviewed. No pertinent past medical history.  History reviewed. No pertinent past surgical history.  There were no vitals filed for this visit.  Visit Diagnosis:  Stiffness of ankle joint, right  Stiffness of ankle joint, left      Subjective Assessment - 06/13/15 1008    Subjective Patient reports she continues to have tightness/awareness/pain in her right heel and stiffness in her lower back.    Limitations Standing;Walking   Patient Stated Goals She has not been as active as she would like, and states this has caused her to gain weight.    Currently in Pain? --  Patient states she has pain/tightness in both her right heel and lower back. She does not rate on 0-10 scale       Manual Therapy  Soft tissue mobilization provided to right calcaneal area, while ankle maintianed in DF and great toe maintained in extension. Grade II-III mobilizations to R great toe into extension with ankle maintained in DF, very well tolerated  Grade I-II mobilizations provided to R ankle into eversion, patient reported stretch in the band of tissue surrounding her heel (c-shaped band on lateral aspects of calcaneus. Relief of symptoms after this technique was performed  PNF stretching of quadriceps in Ely's test  position x 8 repetitions with 5 second holds, well tolerated and decreased symptoms  Grade I-II P-A mobilizations provided to lumbar spine, very well tolerated. Patient reported near resolution of her symptoms after completion.     There-ex  Supine bridging 2 sets x 10 repetitions through partial ROM with toes off the table, cuing for hip extension sensation rather than lumbar tightness.  Supine piriformis stretching x 10 bilaterally for 2 sets Standing lumbar extensions with foam roller x 10, did not appreciably change any symptoms so discontinued.  Standing hip flexor stretch against wall x 10 bilaterally with 3 second holds, no appreciable change in symptoms                            PT Education - 06/13/15 1122    Education provided Yes   Education Details Patient educated to complete HEP whenever she becomes symptomatic to help reduce symptoms. Patient educatedon the importance of adhering to exercise program so PT can monitor and progress/change as appropriate.    Person(s) Educated Patient   Methods Handout;Demonstration;Explanation   Comprehension Returned demonstration;Verbalized understanding             PT Long Term Goals - 06/08/15 1003    PT LONG TERM GOAL #1   Title Pt will incr. hip extension confirmed by negative Ely test, to aid in incr. step length during ambulation.   Time 8   Period Weeks  Status Deferred   PT LONG TERM GOAL #2   Title Pt will report decr. heel pain wiht gait to less than 1/10 to improve functional tolerance.   Time 8   Period Weeks   Status Achieved   PT LONG TERM GOAL #3   Title Pt will be I with HEP for self management of ankle symptoms.   Time 8   Period Weeks   Status Achieved   PT LONG TERM GOAL #4   Title Pt will be able to perform 10 squats pain free with no hyperextension in knees to decr. irritating motor pattern and decr. LE pain.   Time 8   Period Weeks   Status New               Plan -  06/13/15 1125    Clinical Impression Statement Patient found relief with manual techniques provided for plantar fascial pain and "c-shaped" heel pain as well as her lumbar spine. Patient appears to demonstrate a preference for lumbar extension for dynamic mobility rather than hip extension as demonstrated initially on bridging exercise. Patient    Pt will benefit from skilled therapeutic intervention in order to improve on the following deficits Pain;Improper body mechanics;Postural dysfunction;Decreased strength;Difficulty walking   Rehab Potential Good   PT Frequency 2x / week   PT Duration 8 weeks   PT Treatment/Interventions Manual techniques;Therapeutic exercise;Gait training   PT Next Visit Plan Progress with supine bridging through full AROM, manual stretching of plantar fasciitis, begin dynamic standing gluteal activities (step ups, hip abductions, mini squats, etc) to provide re-education of hip extension vs lumbar extension.    PT Home Exercise Plan See patient instructions.    Consulted and Agree with Plan of Care Patient        Problem List There are no active problems to display for this patient.  Kerman Passey, PT, DPT   06/13/2015, 11:40 AM  [facility] [address] [place], [address] Phone: [phone]   Fax:  [phone]

## 2015-06-13 NOTE — Patient Instructions (Addendum)
All exercises provided were adapted from [email or web address]. Patient was provided a written handout with pictures as described. Any additional cues were manually entered in to handout and copied in to this document.   Supine bridge (10 times, 3 second hold, 2 sets, 1x per day)  Lie down on your back and bend your knees so that your feet are flat on the table. Press your heels into the ground to lift your hips off of the table. You should stabilize through your core. Return to the starting position controlling your speed.    Push through your heels, and you may want to have your toes off the table. Go through a range of motion that does not activate your lower back.    Prone Quad Stretch (10 times, 5 second hold, 1 set, 2x per day)  Lie down flat on your stomach. Wrap a strap (belt, towel, dog leash) around the top of one of your feet and pull the strap across your opposite shoulder so that your knee starts to curl up to your body. Pull until a stretch is felt across the front of your thigh.     Plantar Fascia Foot Stretch (10 times, 5 second hold, 2 sets, 3x per day)  1. While sitting at Home or at Work, cross your Right (affected) foot over your opposite knee.  2. With shoes on or off ( works better w shoes off but ok w shoes on like at work) grasp base of all 5 toes (or toe box of shoe) with Right (same side) hand.  3. Pull toes back toward shin until stretch is felt in arch of foot.  4. Hold stretch for 10 seconds. Repeat X 10 on R affected foot.  5. Do this every day 3 times a day especially before standing after sitting for 20+ mins to relieve heel pain.

## 2015-06-16 ENCOUNTER — Ambulatory Visit: Payer: BLUE CROSS/BLUE SHIELD | Admitting: Physical Therapy

## 2015-06-16 DIAGNOSIS — M25672 Stiffness of left ankle, not elsewhere classified: Secondary | ICD-10-CM | POA: Diagnosis not present

## 2015-06-16 DIAGNOSIS — M2569 Stiffness of other specified joint, not elsewhere classified: Secondary | ICD-10-CM

## 2015-06-16 DIAGNOSIS — M2560 Stiffness of unspecified joint, not elsewhere classified: Principal | ICD-10-CM

## 2015-06-16 DIAGNOSIS — M25671 Stiffness of right ankle, not elsewhere classified: Secondary | ICD-10-CM

## 2015-06-16 NOTE — Therapy (Signed)
[facility] [address], [place], [address] Phone: [phone]   Fax:  [phone]  Physical Therapy Treatment  Patient Details  Name: Cynthia Mills MRN: [id number] Date of Birth: [birth year] Referring Provider:  Trula Slade, DPM  Encounter Date: 06/16/2015      PT End of Session - 06/16/15 0908    Visit Number 16   Number of Visits 32   Date for PT Re-Evaluation 07/25/15   PT Start Time 0730   PT Stop Time 0815   PT Time Calculation (min) 45 min   Activity Tolerance Patient tolerated treatment well;No increased pain   Behavior During Therapy [facility] for tasks assessed/performed      No past medical history on file.  No past surgical history on file.  There were no vitals filed for this visit.  Visit Diagnosis:  Back stiffness  Stiffness of ankle joint, right      Subjective Assessment - 06/16/15 0731    Subjective Pt reports she went to the gym two days ago, rode the stationary bike and this was significantly painful for her back, requiring her to take NSAIDs.Back continues to have incr. stiffness. Currently R foot is incr. tender, denies tightness.    Currently in Pain? Yes   Pain Score 3    Pain Location Back                 Objective: CPAs grade IV 3x1 min T10-L3. Following this pt reported decr. Pain in back to 1/10 and pt reported continued stiffness.  Trunk control series: SLR< hooklying single leg raise<hooklying double leg raise with hands under back to feel for poor back control, performed x3 sets to fatigue.  Quadruped child's pose to cobra performed 3x5 very slowly for improving stretch.  Encouragement to continue focusing on quad exercises to continue to improve muscle tone with basic functional tasks.                PT Education - 06/16/15 0856    Education provided Yes   Education Details PT educated pt on positioning when performing  exercise of stationary bike. Issued new HEP of double leg lift, quadruped childs pose/cobra.   Person(s) Educated Patient   Methods Explanation;Demonstration   Comprehension Verbalized understanding;Returned demonstration             PT Long Term Goals - 06/08/15 1003    PT LONG TERM GOAL #1   Title Pt will incr. hip extension confirmed by negative Ely test, to aid in incr. step length during ambulation.   Time 8   Period Weeks   Status Deferred   PT LONG TERM GOAL #2   Title Pt will report decr. heel pain wiht gait to less than 1/10 to improve functional tolerance.   Time 8   Period Weeks   Status Achieved   PT LONG TERM GOAL #3   Title Pt will be I with HEP for self management of ankle symptoms.   Time 8   Period Weeks   Status Achieved   PT LONG TERM GOAL #4   Title Pt will be able to perform 10 squats pain free with no hyperextension in knees to decr. irritating motor pattern and decr. LE pain.   Time 8   Period Weeks   Status New               Plan - 06/16/15 0909    Clinical Impression Statement Incr.  tightness in back which may be due to incr. muscle activity as she is no longer relying on joints for support. Pt continues to have high degree of stiffness in ankles but no c/o pain currently, back pain improved to 0/10 with manual tx and exercise. Pt primary issue is motor pattern conribution to multiple sites of pain.        Problem List There are no active problems to display for this patient.   Denyse Fillion 06/16/2015, 9:16 AM  [facility] [address], [place], [address] Phone: [phone]   Fax:  [phone]

## 2015-06-20 ENCOUNTER — Ambulatory Visit: Payer: BLUE CROSS/BLUE SHIELD | Admitting: Physical Therapy

## 2015-06-20 DIAGNOSIS — M2569 Stiffness of other specified joint, not elsewhere classified: Secondary | ICD-10-CM

## 2015-06-20 DIAGNOSIS — M25672 Stiffness of left ankle, not elsewhere classified: Secondary | ICD-10-CM | POA: Diagnosis not present

## 2015-06-20 DIAGNOSIS — M2560 Stiffness of unspecified joint, not elsewhere classified: Secondary | ICD-10-CM

## 2015-06-20 DIAGNOSIS — M25671 Stiffness of right ankle, not elsewhere classified: Secondary | ICD-10-CM

## 2015-06-20 NOTE — Therapy (Signed)
[facility] [address] [place], [address] Phone: [phone]   Fax:  [phone]  Physical Therapy Treatment  Patient Details  Name: Cynthia Mills MRN: [id number] Date of Birth: 12-20-1966 Referring Provider:  Trula Slade, DPM  Encounter Date: 06/20/2015      PT End of Session - 06/20/15 1006    Visit Number 63   PT Start Time 0915   PT Stop Time 1000   PT Time Calculation (min) 45 min   Activity Tolerance Patient tolerated treatment well   Behavior During Therapy [facility] for tasks assessed/performed      No past medical history on file.  No past surgical history on file.  There were no vitals filed for this visit.  Visit Diagnosis:  Stiffness of ankle joint, right  Back stiffness      Subjective Assessment - 06/20/15 0959    Subjective Pt reports she has seen her pelvic floor PT last week who feels she has poor endurance/active insufficiency with performance of basic functional tasks.   Currently in Pain? No/denies                Objective: Focus of session on postural correctionto minimize compensation with R LE.  Posterior pelvic tilt with knees unlocked, crown suspended. Practicing getting into this position with and without cuiing 15 min total, pt only able to hold this position 1 min at a time due to muscle fatigue.  Following this, pt performed simple shoulder flexion with L, struggled as she prefers to wt into her R side, however when it was pointed out that she does not do this with R shoulder she was able to practice for an extended period of time, with correction, to perform with improved control.  Same performed in hooklying to allow for improvement in performance when abducting shoulder.  Following session pt demonstrated decr. Subtle gait variant of R wt shift with gait.                 PT Education - 06/20/15 1005    Education provided  Yes   Education Details extensive education on posture and control with additional education on laterality with performance of low level exercises in standing.   Person(s) Educated Patient   Methods Explanation   Comprehension Returned demonstration;Verbalized understanding             PT Long Term Goals - 06/08/15 1003    PT LONG TERM GOAL #1   Title Pt will incr. hip extension confirmed by negative Ely test, to aid in incr. step length during ambulation.   Time 8   Period Weeks   Status Deferred   PT LONG TERM GOAL #2   Title Pt will report decr. heel pain wiht gait to less than 1/10 to improve functional tolerance.   Time 8   Period Weeks   Status Achieved   PT LONG TERM GOAL #3   Title Pt will be I with HEP for self management of ankle symptoms.   Time 8   Period Weeks   Status Achieved   PT LONG TERM GOAL #4   Title Pt will be able to perform 10 squats pain free with no hyperextension in knees to decr. irritating motor pattern and decr. LE pain.   Time 8   Period Weeks   Status New               Plan - 06/20/15 1007  Clinical Impression Statement Pt demonstrates altered motor pattern using excess R LE musculature for L UE and wt shift activity which appears to be related to pain. Pain is improved today so able to address more hgiher level motor pattern issues - look to reassess at next session and progress to wt shifting to continue to address R wt activation.        Problem List There are no active problems to display for this patient.   Fisher,Benjamin 06/20/2015, 10:09 AM  [facility] [address], [place], [address] Phone: [phone]   Fax:  [phone]

## 2015-06-23 ENCOUNTER — Ambulatory Visit: Payer: BLUE CROSS/BLUE SHIELD | Admitting: Physical Therapy

## 2015-06-23 DIAGNOSIS — M25672 Stiffness of left ankle, not elsewhere classified: Secondary | ICD-10-CM | POA: Diagnosis not present

## 2015-06-23 DIAGNOSIS — M2560 Stiffness of unspecified joint, not elsewhere classified: Secondary | ICD-10-CM

## 2015-06-23 DIAGNOSIS — M25671 Stiffness of right ankle, not elsewhere classified: Secondary | ICD-10-CM

## 2015-06-23 DIAGNOSIS — M2569 Stiffness of other specified joint, not elsewhere classified: Secondary | ICD-10-CM

## 2015-06-23 NOTE — Therapy (Signed)
[facility] [address], [place], [address] Phone: [phone]   Fax:  [phone]  Physical Therapy Treatment  Patient Details  Name: Cynthia Mills MRN: [id number] Date of Birth: Sep 14, 1966 Referring Provider:  Trula Slade, DPM  Encounter Date: 06/23/2015      PT End of Session - 06/23/15 0813    Visit Number 18   Number of Visits 32   PT Start Time 0730   PT Stop Time 0813   PT Time Calculation (min) 43 min   Activity Tolerance Patient tolerated treatment well      No past medical history on file.  No past surgical history on file.  There were no vitals filed for this visit.  Visit Diagnosis:  Stiffness of ankle joint, right  Back stiffness      Subjective Assessment - 06/23/15 0730    Subjective Pt reports she is doing her new exercises and is feeling like they are helping with her body awareness and to allow her body to relax and detension her back and ankle.    Currently in Pain? No/denies              Objective: Standing posture with minimal correction required to relax back and to engage quads.  Forward back rocking, side to side rocking, hip rotation.  Extensive cuing for these three motions: cued to maintain upright posture, to maintain light engagement of trunk and relaxation of back, anterior pelvic floor engagement, hip joint opening and closing.  Initially when pt performed reported incr. Pain. With extensive cuing able to perform well with no pain. Pt reported extreme fatigue in quads with performance.                   PT Education - 06/23/15 0809    Education provided Yes   Education Details PT educated on posture and wt shifting in multiple planes.   Person(s) Educated Patient   Methods Explanation   Comprehension Verbalized understanding             PT Long Term Goals - 06/08/15 1003    PT LONG TERM GOAL #1   Title Pt  will incr. hip extension confirmed by negative Ely test, to aid in incr. step length during ambulation.   Time 8   Period Weeks   Status Deferred   PT LONG TERM GOAL #2   Title Pt will report decr. heel pain wiht gait to less than 1/10 to improve functional tolerance.   Time 8   Period Weeks   Status Achieved   PT LONG TERM GOAL #3   Title Pt will be I with HEP for self management of ankle symptoms.   Time 8   Period Weeks   Status Achieved   PT LONG TERM GOAL #4   Title Pt will be able to perform 10 squats pain free with no hyperextension in knees to decr. irritating motor pattern and decr. LE pain.   Time 8   Period Weeks   Status New               Plan - 06/23/15 0813    Clinical Impression Statement Pt has improvement in standing posture since previous session with improved quad engagement/decr. knee hyperextension. Continues to have difficulty when adding movement to this. When she wt shifts she engages back which produces pain in plantar fascia, able to correct with cuing and would benefit from continued PT to  address this.        Problem List There are no active problems to display for this patient.   Fisher,Benjamin 06/23/2015, 8:16 AM  [facility] [address], [place], [address] Phone: [phone]   Fax:  [phone]

## 2015-07-14 ENCOUNTER — Ambulatory Visit: Payer: BLUE CROSS/BLUE SHIELD | Attending: Podiatry | Admitting: Physical Therapy

## 2015-07-14 DIAGNOSIS — M25672 Stiffness of left ankle, not elsewhere classified: Secondary | ICD-10-CM | POA: Diagnosis present

## 2015-07-14 DIAGNOSIS — M25671 Stiffness of right ankle, not elsewhere classified: Secondary | ICD-10-CM | POA: Diagnosis present

## 2015-07-14 NOTE — Therapy (Signed)
[facility] [address], [place], [address] Phone: [phone]   Fax:  [phone]  Physical Therapy Treatment  Patient Details  Name: Laporchia Nakajima MRN: [id number] Date of Birth: 1966-12-08 Referring Provider:  Trula Slade, DPM  Encounter Date: 07/14/2015      PT End of Session - 07/14/15 1717    Visit Number 19   Number of Visits 32   Date for PT Re-Evaluation 07/25/15   PT Start Time 1630   PT Stop Time 1718   PT Time Calculation (min) 48 min   Activity Tolerance Patient tolerated treatment well   Behavior During Therapy [facility] for tasks assessed/performed      No past medical history on file.  No past surgical history on file.  There were no vitals filed for this visit.  Visit Diagnosis:  Stiffness of ankle joint, right  Stiffness of ankle joint, left      Subjective Assessment - 07/14/15 1642    Subjective Pt has been very active on a cruise, extensive walking. She had multiple instances of stiff back and stiffness in feet rather than pain.    Currently in Pain? Yes   Pain Score 2    Pain Location Ankle   Pain Orientation Right;Left                Objective:Marland Kitchen Reviewed pt experience walking for long periods of time while on vacation. Pt noted most distinct new piece of info is that when her knees and back hurt her ankles felt better   Based on this PT educated pt on return to her postural exercises and focus on maintaining centration throughout the joints to minimize pain.  Pt reported pain with return to activity in ankles, so PT performed trigger point dry needling to B Quadratus plantae. Following this pt demonstrated improvement in pain with activity.                PT Education - 07/14/15 1714    Education provided Yes   Education Details educated pt on plan of care progressing to incr. exercise to address continued pain   Person(s) Educated  Patient   Methods Explanation   Comprehension Verbalized understanding             PT Long Term Goals - 06/08/15 1003    PT LONG TERM GOAL #1   Title Pt will incr. hip extension confirmed by negative Ely test, to aid in incr. step length during ambulation.   Time 8   Period Weeks   Status Deferred   PT LONG TERM GOAL #2   Title Pt will report decr. heel pain wiht gait to less than 1/10 to improve functional tolerance.   Time 8   Period Weeks   Status Achieved   PT LONG TERM GOAL #3   Title Pt will be I with HEP for self management of ankle symptoms.   Time 8   Period Weeks   Status Achieved   PT LONG TERM GOAL #4   Title Pt will be able to perform 10 squats pain free with no hyperextension in knees to decr. irritating motor pattern and decr. LE pain.   Time 8   Period Weeks   Status New               Plan - 07/14/15 1719    Clinical Impression Statement Plan for next session is to issue more endurance based postural exercises,  will focus on low level squat, rotation, combined with breathing to address continued postural elements of ankle/foot pain. Pt in session noted significant LTR with trigger point dry needling. indicating appropriate response to treatment.        Problem List There are no active problems to display for this patient.   Fisher,Benjamin 07/14/2015, 5:22 PM  [facility] [address] [place], [address] Phone: [phone]   Fax:  [phone]

## 2015-07-15 ENCOUNTER — Encounter: Payer: BLUE CROSS/BLUE SHIELD | Admitting: Physical Therapy

## 2015-07-28 ENCOUNTER — Ambulatory Visit: Payer: BLUE CROSS/BLUE SHIELD | Attending: Podiatry | Admitting: Physical Therapy

## 2015-07-28 DIAGNOSIS — M2560 Stiffness of unspecified joint, not elsewhere classified: Secondary | ICD-10-CM | POA: Diagnosis present

## 2015-07-28 DIAGNOSIS — M2569 Stiffness of other specified joint, not elsewhere classified: Secondary | ICD-10-CM

## 2015-07-28 DIAGNOSIS — M25671 Stiffness of right ankle, not elsewhere classified: Secondary | ICD-10-CM | POA: Diagnosis present

## 2015-07-28 DIAGNOSIS — M25672 Stiffness of left ankle, not elsewhere classified: Secondary | ICD-10-CM | POA: Diagnosis present

## 2015-07-28 NOTE — Therapy (Signed)
[facility] [address], [place], [address] Phone: [phone]   Fax:  [phone]  Physical Therapy Treatment  Patient Details  Name: Onesty Clair MRN: [id number] Date of Birth: [birth year] Referring Provider:  Trula Slade, DPM  Encounter Date: 07/28/2015      PT End of Session - 07/28/15 0944    Visit Number 20   Number of Visits 32   PT Start Time 0815   PT Stop Time 0900   PT Time Calculation (min) 45 min   Activity Tolerance Patient tolerated treatment well   Behavior During Therapy [facility] for tasks assessed/performed      No past medical history on file.  No past surgical history on file.  There were no vitals filed for this visit.  Visit Diagnosis:  Stiffness of ankle joint, right - Plan: PT plan of care cert/re-cert  Stiffness of ankle joint, left - Plan: PT plan of care cert/re-cert  Back stiffness - Plan: PT plan of care cert/re-cert      Subjective Assessment - 07/28/15 0821    Subjective (p) Pt reports more "aches and pains" especially in her knees. Denies pain currently.   Currently in Pain? (p) No/denies         Objective: Extensive postural education to improve joint centration and gentle activation of postural muscles primarily focusing on decr. Lumbar lordosis, decr. Knee hyperextension.  In this position: Partial squat first in the air, then against high table to facilitate improved form. Use of arms to produce flexor moment in hips, added breathing to slow movement down. Pt performed 2x2 min.  Wide stance knees bent wt shift focusing on maintaining same postural rules, maintianing slight bend in both knees. Pt required extensive cuing to maintain improved posture - particularly maintaining knee flexion.  Following this PT educated pt on the importance of consistency with these exercises, and to avoid pain in knees with performance by maintaining weight on  heels rather than toes.                        PT Education - 07/28/15 [phone]    Education provided Yes   Education Details Educated pt on HEP of partial squat, partial squat wt shift.   Person(s) Educated Patient   Methods Explanation   Comprehension Verbalized understanding             PT Long Term Goals - 07/28/15 0953    PT LONG TERM GOAL #1   Title Pt will incr. hip extension confirmed by negative Ely test, to aid in incr. step length during ambulation.   Time 8   Period Weeks   Status Achieved   PT LONG TERM GOAL #2   Title Pt will report decr. heel pain wiht gait to less than 1/10 to improve functional tolerance.   Time 8   Period Weeks   Status Achieved   PT LONG TERM GOAL #3   Title Pt will be I with HEP for self management of ankle symptoms.   Time 8   Period Weeks   Status Achieved   PT LONG TERM GOAL #4   Title Pt will be able to perform 10 squats pain free with no hyperextension in knees to decr. irritating motor pattern and decr. LE pain.   Time 8   Period Weeks   Status Partially Met  Plan - 07/28/15 0945    Clinical Impression Statement Pt will perform regular new endurance exercises, to be reassessed at next session for continued improvement in breathing with functional movements to avoid excessive joint loading which is contributing to pain.   Pt will benefit from skilled therapeutic intervention in order to improve on the following deficits Pain;Improper body mechanics;Postural dysfunction;Decreased strength;Difficulty walking        Problem List There are no active problems to display for this patient.   Noriel Guthrie 07/28/2015, 10:03 AM  [facility] [address], [place], [address] Phone: [phone]   Fax:  [phone]

## 2015-08-03 ENCOUNTER — Ambulatory Visit: Payer: BLUE CROSS/BLUE SHIELD | Admitting: Physical Therapy

## 2015-08-03 DIAGNOSIS — M25671 Stiffness of right ankle, not elsewhere classified: Secondary | ICD-10-CM

## 2015-08-03 NOTE — Therapy (Signed)
[facility] [address] [place], [address] Phone: [phone]   Fax:  [phone]  Physical Therapy Treatment  Patient Details  Name: Cynthia Mills MRN: [id number] Date of Birth: 1966-11-07 Referring Provider:  Trula Slade, DPM  Encounter Date: 08/03/2015      PT End of Session - 08/03/15 1024    Visit Number 21   Number of Visits 32   Date for PT Re-Evaluation 09/03/15   PT Start Time 0908   PT Stop Time 0955   PT Time Calculation (min) 47 min   Activity Tolerance Patient tolerated treatment well   Behavior During Therapy [facility] for tasks assessed/performed      No past medical history on file.  No past surgical history on file.  There were no vitals filed for this visit.  Visit Diagnosis:  Stiffness of ankle joint, right      Subjective Assessment - 08/03/15 1019    Subjective Pt has been performing her HEP, has noticed incr. pain in L knee and R ankle with performance of exercises. currently has mild pain in knee.   Currently in Pain? Yes   Pain Score 2    Pain Location Knee   Pain Orientation Left                   Objectiv e: Extensive review and retraining for mini squat, pt had significant difficulty with this and only able to achieve correct performance with cuing to "sit back" into a chair with requirement to actually tap down onto elevated chair.   Same correction for other two exercises. Primary difficulty for pt is her tendency to perform exercises in PF, able to correct with cuing to feel weight into balls of feet rather than heels, however even this was very challenging for pt and she reported incr. Pain in plantar fascia following this.  L knee trigger point dry needling performed on distal vastus lateralis, adductor.  R ankle PAs 3x30 grade IV, subtalar mobs 3x30. Subtalar mob reproduced pain in plantar fascia but then relieved it. Following  manual tx pt able to perform her exercises pain free.  PT modified HEP, reduced time to 1x1 min twice per day, using pain as guide.             PT Education - 08/03/15 1022    Education provided Yes   Education Details Reviewed and corected performance of mini squat to minimize pain.   Person(s) Educated Patient   Methods Explanation;Demonstration;Tactile cues;Verbal cues   Comprehension Verbalized understanding             PT Long Term Goals - 07/28/15 0953    PT LONG TERM GOAL #1   Title Pt will incr. hip extension confirmed by negative Ely test, to aid in incr. step length during ambulation.   Time 8   Period Weeks   Status Achieved   PT LONG TERM GOAL #2   Title Pt will report decr. heel pain wiht gait to less than 1/10 to improve functional tolerance.   Time 8   Period Weeks   Status Achieved   PT LONG TERM GOAL #3   Title Pt will be I with HEP for self management of ankle symptoms.   Time 8   Period Weeks   Status Achieved   PT LONG TERM GOAL #4   Title Pt will be able to perform 10 squats pain free with no hyperextension in  knees to decr. irritating motor pattern and decr. LE pain.   Time 8   Period Weeks   Status Partially Met               Plan - 08/03/15 1025    Clinical Impression Statement Improvement overall with specific acute PF pain, however even with basic low level activity pt does c/o knee and ankle pain. Pt may benefit from a few additional sessions spaced out once every other week to continue to address these deficits and improve tolerance for basic functional activities.   Pt will benefit from skilled therapeutic intervention in order to improve on the following deficits Pain;Improper body mechanics;Postural dysfunction;Decreased strength;Difficulty walking   Rehab Potential Good   PT Frequency 1x / week   PT Duration 6 weeks   PT Treatment/Interventions Manual techniques;Therapeutic exercise;Gait training        Problem  List There are no active problems to display for this patient.   Karimah Winquist 08/03/2015, 10:28 AM  [facility] [address], [place], [address] Phone: [phone]   Fax:  [phone]

## 2015-08-12 ENCOUNTER — Encounter: Payer: BLUE CROSS/BLUE SHIELD | Admitting: Physical Therapy

## 2015-08-15 ENCOUNTER — Ambulatory Visit: Payer: BLUE CROSS/BLUE SHIELD | Admitting: Physical Therapy

## 2015-08-15 DIAGNOSIS — M25671 Stiffness of right ankle, not elsewhere classified: Secondary | ICD-10-CM

## 2015-08-15 DIAGNOSIS — M25672 Stiffness of left ankle, not elsewhere classified: Secondary | ICD-10-CM

## 2015-08-15 DIAGNOSIS — M2569 Stiffness of other specified joint, not elsewhere classified: Secondary | ICD-10-CM

## 2015-08-15 DIAGNOSIS — M2560 Stiffness of unspecified joint, not elsewhere classified: Secondary | ICD-10-CM

## 2015-08-15 NOTE — Therapy (Signed)
[facility] [address], [place], [address] Phone: [phone]   Fax:  [phone]  Physical Therapy Treatment  Patient Details  Name: Cynthia Mills MRN: [id number] Date of Birth: [birth year] Referring Provider:  Trula Slade, DPM  Encounter Date: 08/15/2015      PT End of Session - 08/15/15 0901    Visit Number 22   Number of Visits 32   Date for PT Re-Evaluation 09/03/15   PT Start Time 0730   PT Stop Time 0815   PT Time Calculation (min) 45 min   Activity Tolerance Patient tolerated treatment well      No past medical history on file.  No past surgical history on file.  There were no vitals filed for this visit.  Visit Diagnosis:  Stiffness of ankle joint, right  Stiffness of ankle joint, left  Back stiffness      Subjective Assessment - 08/15/15 0732    Subjective Pt reports she has been doing her exercises for shorter period of time.    Currently in Pain? No/denies           ObjectivE: Reviewed HEP, encouraged pt to progress to 1.5 min for standing exercises.  Supine pelvic tilt, pelvic tilt with cuing for breathing inhale on ant. Tilt, exhale on posterior. Pt required extensive cuing for timing, coordination, and control.  Maintianing this position - focus on natural breathing pattern with core engagement.  Same position with heel slides. Pt reported pain in L knee.  TDN performed on L quad to reduce pain, following this pt reported 0/10 pain with heel slide. Pt able to perform heel slide with extensive practice and cuing.                      PT Education - 08/15/15 0901    Education provided Yes   Education Details extensive education on activation of multifidi in supine.   Person(s) Educated Patient   Methods Explanation;Tactile cues   Comprehension Verbalized understanding;Returned demonstration;Verbal cues required;Need further  instruction             PT Long Term Goals - 07/28/15 0953    PT LONG TERM GOAL #1   Title Pt will incr. hip extension confirmed by negative Ely test, to aid in incr. step length during ambulation.   Time 8   Period Weeks   Status Achieved   PT LONG TERM GOAL #2   Title Pt will report decr. heel pain wiht gait to less than 1/10 to improve functional tolerance.   Time 8   Period Weeks   Status Achieved   PT LONG TERM GOAL #3   Title Pt will be I with HEP for self management of ankle symptoms.   Time 8   Period Weeks   Status Achieved   PT LONG TERM GOAL #4   Title Pt will be able to perform 10 squats pain free with no hyperextension in knees to decr. irritating motor pattern and decr. LE pain.   Time 8   Period Weeks   Status Partially Met               Plan - 08/15/15 0902    Clinical Impression Statement Pt has had decr. pain, continues to have mild back, knee and plantar fascia pain intermittently. Pt is demonstrating improved sitting and standing posture, however with functional activity of sit<>stand and walking pt still has difficulty maintianing neutral  spinal posture which is incr. pain in PF due to standing lack of DF.   Pt will benefit from skilled therapeutic intervention in order to improve on the following deficits Pain;Improper body mechanics;Postural dysfunction;Decreased strength;Difficulty walking   Rehab Potential Good   PT Frequency 1x / week   PT Duration 6 weeks        Problem List There are no active problems to display for this patient.   Kason Benak 08/15/2015, 9:04 AM  [facility] [address] [place], [address] Phone: [phone]   Fax:  [phone]

## 2015-09-01 ENCOUNTER — Other Ambulatory Visit: Payer: Self-pay | Admitting: Podiatry

## 2015-09-01 ENCOUNTER — Encounter: Payer: BLUE CROSS/BLUE SHIELD | Admitting: Physical Therapy

## 2015-09-01 ENCOUNTER — Telehealth: Payer: Self-pay | Admitting: *Deleted

## 2015-09-01 ENCOUNTER — Ambulatory Visit: Payer: BLUE CROSS/BLUE SHIELD | Attending: Podiatry | Admitting: Physical Therapy

## 2015-09-01 DIAGNOSIS — M25671 Stiffness of right ankle, not elsewhere classified: Secondary | ICD-10-CM | POA: Diagnosis not present

## 2015-09-01 NOTE — Therapy (Signed)
[facility] [address], [place], [address] Phone: [phone]   Fax:  [phone]  Physical Therapy Treatment  Patient Details  Name: Cynthia Mills MRN: [id number] Date of Birth: 1966-06-09 Referring Provider:  Trula Slade, DPM  Encounter Date: 09/01/2015      PT End of Session - 09/01/15 1520    Visit Number 23   Number of Visits 32   Date for PT Re-Evaluation 09/03/15   PT Start Time 7322   PT Stop Time 1430   PT Time Calculation (min) 45 min   Activity Tolerance Patient tolerated treatment well   Behavior During Therapy [facility] for tasks assessed/performed      No past medical history on file.  No past surgical history on file.  There were no vitals filed for this visit.  Visit Diagnosis:  Stiffness of ankle joint, right      Subjective Assessment - 09/01/15 1517    Subjective Pt reports her back and knees are doing well, she is having significant incr. in ankle pain.   Limitations Walking   Currently in Pain? Yes   Pain Score 3    Pain Location Ankle   Pain Orientation Right             Objective: Educated pt on performance of isometric exercises to address tendinopathy, performed 2x6 with 6 sec. Holds for DF, PF, inv., ev, multiple sites on PF. Educated pt on performance of this at home.  IASTM performed on medial and lateral calf, on achilles tendon. Noted edema in medial distal achilles tendon. Educated pt on monitoring this symptom and encouraged pt to return to MD if necessary for this.  PT encouraged pt to return to using her boot and to decr. Longer (3 mile) walks for short period of time. Pt will check in in approximately 1 week.                    PT Education - 09/01/15 1518    Education provided Yes   Education Details Educated pt on returning to use of boot, isometrics   Person(s) Educated Patient   Methods Explanation   Comprehension Verbalized understanding             PT Long Term Goals - 09/01/15 1525    PT LONG TERM GOAL #1   Title Pt will incr. hip extension confirmed by negative Ely test, to aid in incr. step length during ambulation.   Status Achieved   PT LONG TERM GOAL #2   Title Pt will report decr. heel pain wiht gait to less than 1/10 to improve functional tolerance.   Status Achieved   PT LONG TERM GOAL #3   Title Pt will be I with HEP for self management of ankle symptoms.   Status Achieved   PT LONG TERM GOAL #4   Title Pt will be able to perform 10 squats pain free with no hyperextension in knees to decr. irritating motor pattern and decr. LE pain.   Status Achieved               Plan - 09/01/15 1521    Clinical Impression Statement Pt has incr. pain in R achilles region. Otherwise pain is significantly improved. PT educated pt on understanding how to manage tendinopathy pain. Overall pt has improved functional control, decr. pain in back, knees, and foot but significantly incr. pain in achilles tendon with some swelling in medial  compartment of achilles tendon. Pt is frustrated that she is not doing better, although she acknowledges some improvement. Pt has definitely hit a plateau and is appropriate for d/c at this time.   Pt will benefit from skilled therapeutic intervention in order to improve on the following deficits Pain;Improper body mechanics;Postural dysfunction;Decreased strength;Difficulty walking   PT Frequency 1x / week   PT Duration 6 weeks   PT Treatment/Interventions Manual techniques;Therapeutic exercise;Gait training        Problem List There are no active problems to display for this patient.   Anoop Hemmer 09/01/2015, 3:27 PM  [facility] [address] [place], [address] Phone: [phone]   Fax:  [phone]

## 2015-09-01 NOTE — Telephone Encounter (Signed)
Pt is requesting refill of Mobic, LOV 03/2015.

## 2015-09-01 NOTE — Telephone Encounter (Signed)
OK to refill

## 2015-09-02 NOTE — Telephone Encounter (Signed)
Pt needs an appt prior to future refills. 

## 2015-09-30 ENCOUNTER — Ambulatory Visit: Payer: BLUE CROSS/BLUE SHIELD | Attending: Podiatry | Admitting: Physical Therapy

## 2015-09-30 ENCOUNTER — Ambulatory Visit: Payer: BLUE CROSS/BLUE SHIELD | Admitting: Physical Therapy

## 2015-09-30 DIAGNOSIS — M25671 Stiffness of right ankle, not elsewhere classified: Secondary | ICD-10-CM | POA: Insufficient documentation

## 2015-09-30 DIAGNOSIS — M25672 Stiffness of left ankle, not elsewhere classified: Secondary | ICD-10-CM | POA: Diagnosis present

## 2015-09-30 NOTE — Therapy (Signed)
[facility] [address], [place], [address] Phone: [phone]   Fax:  [phone]  Physical Therapy Treatment  Patient Details  Name: Cynthia Mills MRN: [id number] Date of Birth: 08-05-1966 Referring Provider:  Trula Slade, DPM  Encounter Date: 09/30/2015      PT End of Session - 09/30/15 0852    Visit Number 24   Number of Visits 32   Date for PT Re-Evaluation 10/03/15   PT Start Time 0750   PT Stop Time 0850   PT Time Calculation (min) 60 min   Activity Tolerance Patient tolerated treatment well   Behavior During Therapy [facility] for tasks assessed/performed      No past medical history on file.  No past surgical history on file.  There were no vitals filed for this visit.  Visit Diagnosis:  Stiffness of ankle joint, right - Plan: PT plan of care cert/re-cert  Stiffness of ankle joint, left - Plan: PT plan of care cert/re-cert      Subjective Assessment - 09/30/15 0809    Subjective Pt reports improvement in back, ankle pain. She is currently limiting her activity, only walking in boot and has been exercising routinely.   Currently in Pain? Yes   Pain Score 2    Pain Location Ankle   Pain Orientation Right;Left       Objective: Reviewed HEP, added elevated ankle pumps to decr. Swelling in R achilles region.  STM performed on R achilles, globally and focused on distal lateral aspect to address pain.  Same performed on quad tendon as pain in L knee was limiting ability to perform exercises.  Following STM pt reported decr. Pain in L knee and R achilles region.  Trigger point dry needling performed on B QP muscles, macerate, piston and twist techniques used. Following session pt reported incr. Soreness but demonstrated improved pain free DF by 5 degr. B.  PT educated pt on modifying use of boot to incr. Out of boot time, incr. Cycling by 5 min each  session.                          PT Education - 09/30/15 0851    Education provided Yes   Education Details Educated pt on HEP   Person(s) Educated Patient   Methods Explanation   Comprehension Verbalized understanding             PT Long Term Goals - 09/30/15 0856    PT LONG TERM GOAL #1   Title Pt will incr. hip extension confirmed by negative Ely test, to aid in incr. step length during ambulation.   Status Achieved   PT LONG TERM GOAL #2   Title Pt will report decr. heel pain wiht gait to less than 1/10 to improve functional tolerance.   Status Achieved   PT LONG TERM GOAL #3   Title Pt will be I with HEP for self management of ankle symptoms.   Status Achieved   PT LONG TERM GOAL #4   Title Pt will be able to perform 10 squats pain free with no hyperextension in knees to decr. irritating motor pattern and decr. LE pain.   Status Achieved   PT LONG TERM GOAL #5   Title Pt will be able to walk 30 min out of boot with no c/o pain   Baseline 5 min   Time 4   Period Weeks  Status New               Plan - 09/30/15 6761    Clinical Impression Statement Pt has made significant progress in all aspects of pain. Still demonstrates mild R ankle pain and edema laterally to achilles insertion, very mild plantar fascia pain, and L knee pain. Pt is modifying activity - using boot for walking activity. Pt would benefit from progression of therapy to continue to make improvements in pain and impaired gait.   Pt will benefit from skilled therapeutic intervention in order to improve on the following deficits Pain;Improper body mechanics;Postural dysfunction;Decreased strength;Difficulty walking   Rehab Potential Good   PT Frequency 1x / week   PT Duration 4 weeks   PT Treatment/Interventions Manual techniques;Therapeutic exercise;Gait training;Dry needling        Problem List There are no active problems to display for this  patient.   Aanya Haynes PT 09/30/2015, 9:02 AM  [place] [facility] [address], [place], [address] Phone: [phone]   Fax:  [phone]

## 2015-10-17 DIAGNOSIS — E559 Vitamin D deficiency, unspecified: Secondary | ICD-10-CM | POA: Insufficient documentation

## 2015-10-17 DIAGNOSIS — M722 Plantar fascial fibromatosis: Secondary | ICD-10-CM | POA: Insufficient documentation

## 2015-10-17 DIAGNOSIS — J302 Other seasonal allergic rhinitis: Secondary | ICD-10-CM | POA: Insufficient documentation

## 2015-10-21 ENCOUNTER — Ambulatory Visit: Payer: BLUE CROSS/BLUE SHIELD | Admitting: Physical Therapy

## 2015-10-21 DIAGNOSIS — M25672 Stiffness of left ankle, not elsewhere classified: Secondary | ICD-10-CM

## 2015-10-21 DIAGNOSIS — M25671 Stiffness of right ankle, not elsewhere classified: Secondary | ICD-10-CM

## 2015-10-21 NOTE — Therapy (Signed)
[facility] [address], [place], [address] Phone: [phone]   Fax:  [phone]  Physical Therapy Treatment  Patient Details  Name: Cynthia Mills MRN: [id number] Date of Birth: 11-Oct-1966 No Data Recorded  Encounter Date: 10/21/2015      PT End of Session - 10/21/15 0743    Visit Number 25   Number of Visits 32   Date for PT Re-Evaluation 10/03/15   PT Start Time 0730   PT Stop Time 0808   PT Time Calculation (min) 38 min   Activity Tolerance Patient tolerated treatment well   Behavior During Therapy [facility] for tasks assessed/performed      No past medical history on file.  No past surgical history on file.  There were no vitals filed for this visit.  Visit Diagnosis:  Stiffness of ankle joint, left  Stiffness of ankle joint, right      Subjective Assessment - 10/21/15 0736    Subjective Pt reports she is doing significantly better. She has been using her boot less over the past two weeks and over the past 3 days and is now walking without the boot.    Limitations Walking   Patient Stated Goals She has not been as active as she would like, and states this has caused her to gain weight.    Currently in Pain? No/denies            Objective: Extensive education and performance of exercise progression:  Progressing to incr. amb without boot. Added foot intrinsic exercises to improve short foot skill.  Ankle DF on L actively, active with manual assistance.  Knee tib/fib mobs 3x1 min. Medial tibial mobs 3x1 min, both grade IV.  Navicular mobs 5x1 min - pt reports that this perfectly reproduced her pain and following this improved DF to 15 degr. Greater than before mobs.  Pt is able to verbalize and demonstrate appropriate performance of HEP.                     PT Education - 10/21/15 0739    Education provided Yes   Education Details educated on  continuing progression.   Person(s) Educated Patient   Methods Explanation   Comprehension Verbalized understanding             PT Long Term Goals - 10/21/15 9379    PT LONG TERM GOAL #1   Title Pt will incr. hip extension confirmed by negative Ely test, to aid in incr. step length during ambulation.   Status Achieved   PT LONG TERM GOAL #2   Title Pt will report decr. heel pain wiht gait to less than 1/10 to improve functional tolerance.   Status Achieved   PT LONG TERM GOAL #3   Title Pt will be I with HEP for self management of ankle symptoms.   Status Achieved   PT LONG TERM GOAL #4   Title Pt will be able to perform 10 squats pain free with no hyperextension in knees to decr. irritating motor pattern and decr. LE pain.   Status Achieved   PT LONG TERM GOAL #5   Title Pt will be able to walk 30 min out of boot with no c/o pain   Baseline 5 min   Time 4   Period Weeks   Status Achieved               Plan - 10/21/15 0240  Clinical Impression Statement At this time pt is appropriate for d/c as she has made significant improvement in pain and activity limitation. Pt may benefit from additional therapy in the future following her being seen by rheumatologist due to elevated CRP.   Pt will benefit from skilled therapeutic intervention in order to improve on the following deficits Pain;Improper body mechanics;Postural dysfunction;Decreased strength;Difficulty walking   Rehab Potential Good   PT Frequency 1x / week   PT Duration 4 weeks   PT Treatment/Interventions Manual techniques;Therapeutic exercise;Gait training;Dry needling        Problem List There are no active problems to display for this patient.   Fisher,Benjamin 10/21/2015, 8:41 AM  [facility] [address], [place], [address] Phone: [phone]   Fax:  [phone]  Name: Atira Borello MRN: [id number] Date  of Birth: 1966-10-31

## 2015-10-24 ENCOUNTER — Other Ambulatory Visit: Payer: Self-pay | Admitting: Podiatry

## 2015-12-09 DIAGNOSIS — R700 Elevated erythrocyte sedimentation rate: Secondary | ICD-10-CM | POA: Insufficient documentation

## 2015-12-09 DIAGNOSIS — M2550 Pain in unspecified joint: Secondary | ICD-10-CM | POA: Insufficient documentation

## 2015-12-09 DIAGNOSIS — R7982 Elevated C-reactive protein (CRP): Secondary | ICD-10-CM | POA: Insufficient documentation

## 2016-01-06 ENCOUNTER — Encounter: Payer: Self-pay | Admitting: *Deleted

## 2016-01-09 ENCOUNTER — Ambulatory Visit: Payer: BLUE CROSS/BLUE SHIELD | Admitting: Anesthesiology

## 2016-01-09 ENCOUNTER — Encounter
Admission: RE | Disposition: A | Discharge status: Home / Self Care | Payer: Self-pay | Source: Ambulatory Visit | Attending: Unknown Physician Specialty

## 2016-01-09 ENCOUNTER — Ambulatory Visit
Admission: RE | Admit: 2016-01-09 | Discharge: 2016-01-09 | Disposition: A | Discharge status: Home / Self Care | Payer: BLUE CROSS/BLUE SHIELD | Source: Ambulatory Visit | Attending: Unknown Physician Specialty | Admitting: Unknown Physician Specialty

## 2016-01-09 DIAGNOSIS — J309 Allergic rhinitis, unspecified: Secondary | ICD-10-CM | POA: Diagnosis not present

## 2016-01-09 DIAGNOSIS — K640 First degree hemorrhoids: Secondary | ICD-10-CM | POA: Insufficient documentation

## 2016-01-09 DIAGNOSIS — K210 Gastro-esophageal reflux disease with esophagitis: Secondary | ICD-10-CM | POA: Insufficient documentation

## 2016-01-09 DIAGNOSIS — E669 Obesity, unspecified: Secondary | ICD-10-CM | POA: Diagnosis not present

## 2016-01-09 DIAGNOSIS — Z791 Long term (current) use of non-steroidal anti-inflammatories (NSAID): Secondary | ICD-10-CM | POA: Diagnosis not present

## 2016-01-09 DIAGNOSIS — R12 Heartburn: Secondary | ICD-10-CM | POA: Diagnosis present

## 2016-01-09 DIAGNOSIS — Z79899 Other long term (current) drug therapy: Secondary | ICD-10-CM | POA: Diagnosis not present

## 2016-01-09 DIAGNOSIS — Z1211 Encounter for screening for malignant neoplasm of colon: Secondary | ICD-10-CM | POA: Insufficient documentation

## 2016-01-09 DIAGNOSIS — E785 Hyperlipidemia, unspecified: Secondary | ICD-10-CM | POA: Insufficient documentation

## 2016-01-09 DIAGNOSIS — Z7984 Long term (current) use of oral hypoglycemic drugs: Secondary | ICD-10-CM | POA: Diagnosis not present

## 2016-01-09 DIAGNOSIS — K259 Gastric ulcer, unspecified as acute or chronic, without hemorrhage or perforation: Secondary | ICD-10-CM | POA: Diagnosis not present

## 2016-01-09 DIAGNOSIS — K5730 Diverticulosis of large intestine without perforation or abscess without bleeding: Secondary | ICD-10-CM | POA: Insufficient documentation

## 2016-01-09 DIAGNOSIS — E282 Polycystic ovarian syndrome: Secondary | ICD-10-CM | POA: Diagnosis not present

## 2016-01-09 DIAGNOSIS — Z87898 Personal history of other specified conditions: Secondary | ICD-10-CM | POA: Insufficient documentation

## 2016-01-09 DIAGNOSIS — K2980 Duodenitis without bleeding: Secondary | ICD-10-CM | POA: Insufficient documentation

## 2016-01-09 DIAGNOSIS — K319 Disease of stomach and duodenum, unspecified: Secondary | ICD-10-CM | POA: Insufficient documentation

## 2016-01-09 DIAGNOSIS — D122 Benign neoplasm of ascending colon: Secondary | ICD-10-CM | POA: Insufficient documentation

## 2016-01-09 HISTORY — PX: COLONOSCOPY WITH PROPOFOL: SHX5780

## 2016-01-09 HISTORY — DX: Vitamin D deficiency, unspecified: E55.9

## 2016-01-09 HISTORY — PX: ESOPHAGOGASTRODUODENOSCOPY (EGD) WITH PROPOFOL: SHX5813

## 2016-01-09 HISTORY — DX: Hyperlipidemia, unspecified: E78.5

## 2016-01-09 HISTORY — DX: Polycystic ovarian syndrome: E28.2

## 2016-01-09 HISTORY — DX: Allergic rhinitis, unspecified: J30.9

## 2016-01-09 HISTORY — DX: Plantar fascial fibromatosis: M72.2

## 2016-01-09 LAB — POCT PREGNANCY, URINE: PREG TEST UR: NEGATIVE

## 2016-01-09 SURGERY — COLONOSCOPY WITH PROPOFOL
Anesthesia: General

## 2016-01-09 MED ORDER — FENTANYL CITRATE (PF) 100 MCG/2ML IJ SOLN
INTRAMUSCULAR | Status: DC | PRN
  Administered 2016-01-09: 50 ug via INTRAVENOUS

## 2016-01-09 MED ORDER — SODIUM CHLORIDE 0.9 % IV SOLN
INTRAVENOUS | Status: DC
  Administered 2016-01-09: 1000 mL via INTRAVENOUS

## 2016-01-09 MED ORDER — MIDAZOLAM HCL 5 MG/5ML IJ SOLN
INTRAMUSCULAR | Status: DC | PRN
  Administered 2016-01-09: 1 mg via INTRAVENOUS

## 2016-01-09 MED ORDER — PROPOFOL 500 MG/50ML IV EMUL
INTRAVENOUS | Status: DC | PRN
  Administered 2016-01-09: 50 ug/kg/min via INTRAVENOUS

## 2016-01-09 MED ORDER — LIDOCAINE HCL (CARDIAC) 20 MG/ML IV SOLN
INTRAVENOUS | Status: DC | PRN
  Administered 2016-01-09: 100 mg via INTRAVENOUS

## 2016-01-09 MED ORDER — SODIUM CHLORIDE 0.9 % IV SOLN: INTRAVENOUS | Status: DC

## 2016-01-09 MED ORDER — PROPOFOL 10 MG/ML IV BOLUS
INTRAVENOUS | Status: DC | PRN
  Administered 2016-01-09: 80 mg via INTRAVENOUS

## 2016-01-09 MED ORDER — GLYCOPYRROLATE 0.2 MG/ML IJ SOLN
INTRAMUSCULAR | Status: DC | PRN
  Administered 2016-01-09: 0.2 mg via INTRAVENOUS

## 2016-01-09 NOTE — Op Note (Signed)
[facility] Patient Name: Cynthia Mills Procedure Date: 01/09/2016 9:57 AM MRN: [id number] Account #: [id number] Date of Birth: [birth year] Admit Type: Outpatient Age: 49 Room: [facility] ENDO ROOM 1 Gender: Female Note Status: Finalized Procedure:         Upper GI endoscopy Indications:       Heartburn Providers:         Manya Silvas, MD Referring MD:      Caprice Renshaw (Referring MD) Medicines:         Propofol per Anesthesia Complications:     No immediate complications. Procedure:         Pre-Anesthesia Assessment:                    - After reviewing the risks and benefits, the patient was                     deemed in satisfactory condition to undergo the procedure.                    After obtaining informed consent, the endoscope was passed                     under direct vision. Throughout the procedure, the                     patient's blood pressure, pulse, and oxygen saturations                     were monitored continuously. The Endoscope was introduced                     through the mouth, and advanced to the second part of                     duodenum. The upper GI endoscopy was accomplished without                     difficulty. The patient tolerated the procedure well. Findings:      LA Grade C (one or more mucosal breaks continuous between tops of 2 or       more mucosal folds, less than 75% circumference) esophagitis with no       bleeding was found 35 cm from the incisors. Superficial ulcers present       in most of circumference. Biopsies were taken with a cold forceps for       histology.      One non-bleeding superficial gastric ulcer with no stigmata of bleeding       was found in the prepyloric region of the stomach. The lesion was 2 mm       in largest dimension. Biopsies were taken with a cold forceps for       histology. Biopsies were taken with a cold forceps for Helicobacter       pylori testing.  Diffuse mildly erythematous mucosa without bleeding was found in the       gastric body. Biopsies were taken with a cold forceps for histology.       Biopsies were taken with a cold forceps for Helicobacter pylori testing.      Diffuse mildly erythematous mucosa without active bleeding and with no       stigmata of bleeding was found in the duodenal bulb. Biopsies were taken  with a cold forceps for histology.      The 2nd part of the duodenum was normal. Biopsies were taken with a cold       forceps for histology. Biopsies for histology were taken with a cold       forceps for for evaluation of possible celiac disease. Impression:        - LA Grade C reflux esophagitis. Biopsied.                    - Gastric ulcer with clean base. Biopsied.                    - Erythematous mucosa in the gastric body. Biopsied.                    - Erythematous duodenopathy. Biopsied.                    - Normal 2nd part of the duodenum. Biopsied. Recommendation:    - Await pathology results. PPI twice a day. Manya Silvas, MD 01/09/2016 10:21:55 AM This report has been signed electronically. Number of Addenda: 0 Note Initiated On: 01/09/2016 9:57 AM      [facility]

## 2016-01-09 NOTE — Anesthesia Preprocedure Evaluation (Signed)
Anesthesia Evaluation  Patient identified by MRN, date of birth, ID band Patient awake    Reviewed: Allergy & Precautions, H&P , NPO status , Patient's Chart, lab work & pertinent test results, reviewed documented beta blocker date and time   History of Anesthesia Complications Negative for: history of anesthetic complications  Airway Mallampati: I  TM Distance: >3 FB Neck ROM: full    Dental no notable dental hx. (+) Teeth Intact   Pulmonary neg pulmonary ROS,    Pulmonary exam normal breath sounds clear to auscultation       Cardiovascular Exercise Tolerance: Good negative cardio ROS Normal cardiovascular exam Rhythm:regular Rate:Normal     Neuro/Psych negative neurological ROS  negative psych ROS   GI/Hepatic negative GI ROS, Neg liver ROS,   Endo/Other  neg diabetesMorbid obesity  Renal/GU negative Renal ROS  negative genitourinary   Musculoskeletal   Abdominal   Peds  Hematology negative hematology ROS (+)   Anesthesia Other Findings Past Medical History:   PCOS (polycystic ovarian syndrome)                           Hyperlipemia                                                 Allergic rhinitis                                            Vitamin D deficiency                                         Plantar fasciitis                                            Reproductive/Obstetrics negative OB ROS                             Anesthesia Physical Anesthesia Plan  ASA: III  Anesthesia Plan: General   Post-op Pain Management:    Induction:   Airway Management Planned:   Additional Equipment:   Intra-op Plan:   Post-operative Plan:   Informed Consent: I have reviewed the patients History and Physical, chart, labs and discussed the procedure including the risks, benefits and alternatives for the proposed anesthesia with the patient or authorized representative who has  indicated his/her understanding and acceptance.   Dental Advisory Given  Plan Discussed with: Anesthesiologist, CRNA and Surgeon  Anesthesia Plan Comments:         Anesthesia Quick Evaluation

## 2016-01-09 NOTE — Anesthesia Postprocedure Evaluation (Signed)
Anesthesia Post Note  Patient: Cynthia Mills  Procedure(s) Performed: Procedure(s) (LRB): COLONOSCOPY WITH PROPOFOL (N/A) ESOPHAGOGASTRODUODENOSCOPY (EGD) WITH PROPOFOL (N/A)  Patient location during evaluation: Endoscopy Anesthesia Type: General Level of consciousness: awake and alert Pain management: pain level controlled Vital Signs Assessment: post-procedure vital signs reviewed and stable Respiratory status: spontaneous breathing, nonlabored ventilation, respiratory function stable and patient connected to nasal cannula oxygen Cardiovascular status: blood pressure returned to baseline and stable Postop Assessment: no signs of nausea or vomiting Anesthetic complications: no    Last Vitals:  Filed Vitals:   01/09/16 1049 01/09/16 1059  BP: 98/64 122/86  Pulse: 74 77  Temp:    Resp: 16 18    Last Pain: There were no vitals filed for this visit.               Martha Clan

## 2016-01-09 NOTE — Transfer of Care (Signed)
Immediate Anesthesia Transfer of Care Note  Patient: Cynthia Mills  Procedure(s) Performed: Procedure(s): COLONOSCOPY WITH PROPOFOL (N/A) ESOPHAGOGASTRODUODENOSCOPY (EGD) WITH PROPOFOL (N/A)  Patient Location: PACU  Anesthesia Type:General  Level of Consciousness: awake, alert , oriented and patient cooperative  Airway & Oxygen Therapy: Patient Spontanous Breathing and Patient connected to nasal cannula oxygen  Post-op Assessment: Report given to RN and Post -op Vital signs reviewed and stable  Post vital signs: Reviewed and stable  Last Vitals:  Filed Vitals:   01/09/16 0905 01/09/16 1042  BP: 133/84 90/57  Pulse:  82  Temp:  36 C  Resp:  19    Complications: No apparent anesthesia complications

## 2016-01-09 NOTE — H&P (Signed)
   Primary Care Physician:  Marcello Fennel, MD Primary Gastroenterologist:  Dr. Vira Agar  Pre-Procedure History & Physical: HPI:  Cynthia Mills is a 50 y.o. female is here for an endoscopy and colonoscopy.   Past Medical History  Diagnosis Date  . PCOS (polycystic ovarian syndrome)   . Hyperlipemia   . Allergic rhinitis   . Vitamin D deficiency   . Plantar fasciitis     History reviewed. No pertinent past surgical history.  Prior to Admission medications   Medication Sig Start Date End Date Taking? Authorizing Provider  cetirizine (ZYRTEC) 10 MG tablet Take by mouth.    Historical Provider, MD  meloxicam (MOBIC) 7.5 MG tablet Take 1 tablet (7.5 mg total) by mouth daily. 03/03/15   Trula Slade, DPM  meloxicam (MOBIC) 7.5 MG tablet TAKE ONE TABLET BY MOUTH EVERY DAY 09/02/15   Trula Slade, DPM  metFORMIN (GLUCOPHAGE-XR) 500 MG 24 hr tablet  02/18/15   Historical Provider, MD  Multiple Vitamin (MULTI-VITAMINS) TABS Take by mouth.    Historical Provider, MD    Allergies as of 11/22/2015  . (No Known Allergies)    History reviewed. No pertinent family history.  Social History   Social History  . Marital Status: Married    Spouse Name: N/A  . Number of Children: N/A  . Years of Education: N/A   Occupational History  . Not on file.   Social History Main Topics  . Smoking status: Never Smoker   . Smokeless tobacco: Not on file  . Alcohol Use: Not on file  . Drug Use: Not on file  . Sexual Activity: Not on file   Other Topics Concern  . Not on file   Social History Narrative    Review of Systems: See HPI, otherwise negative ROS  Physical Exam: BP 133/84 mmHg  Pulse 82  Temp(Src) 97.7 F (36.5 C) (Tympanic)  Ht 5' 2.5" (1.588 m)  Wt 99.791 kg (220 lb)  BMI 39.57 kg/m2  SpO2 100% General:   Alert,  pleasant and cooperative in NAD Head:  Normocephalic and atraumatic. Neck:  Supple; no masses or thyromegaly. Lungs:  Clear throughout to  auscultation.    Heart:  Regular rate and rhythm. Abdomen:  Soft, nontender and nondistended. Normal bowel sounds, without guarding, and without rebound.  Mildly obese. Neurologic:  Alert and  oriented x4;  grossly normal neurologically.  Impression/Plan: Cynthia Mills is here for an endoscopy and colonoscopy to be performed for screening colonoscopy and EGD for heartburn  Risks, benefits, limitations, and alternatives regarding  endoscopy and colonoscopy have been reviewed with the patient.  Questions have been answered.  All parties agreeable.   Gaylyn Cheers, MD  01/09/2016, 9:59 AM

## 2016-01-09 NOTE — Op Note (Signed)
[facility] Patient Name: Cynthia Mills Procedure Date: 01/09/2016 9:56 AM MRN: [id number] Account #: [id number] Date of Birth: November 05, 1966 Admit Type: Outpatient Age: 50 Room: [facility] ENDO ROOM 1 Gender: Female Note Status: Finalized Procedure:         Colonoscopy Indications:       Screening for colorectal malignant neoplasm Providers:         Manya Silvas, MD Referring MD:      Caprice Renshaw (Referring MD) Medicines:         Propofol per Anesthesia Complications:     No immediate complications. Procedure:         Pre-Anesthesia Assessment:                    - After reviewing the risks and benefits, the patient was                     deemed in satisfactory condition to undergo the procedure.                    After obtaining informed consent, the colonoscope was                     passed under direct vision. Throughout the procedure, the                     patient's blood pressure, pulse, and oxygen saturations                     were monitored continuously. The Colonoscope was                     introduced through the anus and advanced to the the                     terminal ileum. The patient tolerated the procedure well. Findings:      A diminutive polyp was found in the ascending colon. The polyp was       sessile. The polyp was removed with a jumbo cold forceps. Resection and       retrieval were complete.      A few small-mouthed diverticula were found in the sigmoid colon.      Internal hemorrhoids were found during endoscopy. The hemorrhoids were       small and Grade I (internal hemorrhoids that do not prolapse).      The terminal ileum appeared normal. Biopsies were taken with a cold       forceps for histology. Impression:        - One diminutive polyp in the ascending colon. Resected                     and retrieved.                    - Diverticulosis in the sigmoid colon.                    - Internal  hemorrhoids.                    - The examined portion of the ileum was normal. Biopsied. Recommendation:    - Await pathology results. Manya Silvas, MD 01/09/2016 10:36:06 AM This report has been signed electronically. Number of Addenda: 0 Note Initiated On: 01/09/2016 9:56 AM Scope  Withdrawal Time: 0 hours 7 minutes 23 seconds  Total Procedure Duration: 0 hours 9 minutes 36 seconds       [facility]

## 2016-01-11 ENCOUNTER — Encounter: Payer: Self-pay | Admitting: Unknown Physician Specialty

## 2016-01-11 LAB — SURGICAL PATHOLOGY

## 2016-04-16 ENCOUNTER — Other Ambulatory Visit: Payer: Self-pay

## 2016-04-16 DIAGNOSIS — Z1231 Encounter for screening mammogram for malignant neoplasm of breast: Secondary | ICD-10-CM

## 2016-05-16 ENCOUNTER — Ambulatory Visit
Admission: RE | Admit: 2016-05-16 | Discharge: 2016-05-16 | Disposition: A | Discharge status: Home / Self Care | Payer: BLUE CROSS/BLUE SHIELD | Source: Ambulatory Visit

## 2016-05-16 DIAGNOSIS — Z1231 Encounter for screening mammogram for malignant neoplasm of breast: Secondary | ICD-10-CM

## 2017-04-04 ENCOUNTER — Other Ambulatory Visit: Payer: Self-pay | Admitting: Obstetrics and Gynecology

## 2017-04-04 DIAGNOSIS — Z1231 Encounter for screening mammogram for malignant neoplasm of breast: Secondary | ICD-10-CM

## 2017-05-17 ENCOUNTER — Ambulatory Visit: Payer: BLUE CROSS/BLUE SHIELD

## 2017-05-27 ENCOUNTER — Encounter (INDEPENDENT_AMBULATORY_CARE_PROVIDER_SITE_OTHER): Payer: Self-pay

## 2017-05-27 ENCOUNTER — Ambulatory Visit
Admission: RE | Admit: 2017-05-27 | Discharge: 2017-05-27 | Disposition: A | Discharge status: Home / Self Care | Payer: BLUE CROSS/BLUE SHIELD | Source: Ambulatory Visit | Attending: Obstetrics and Gynecology | Admitting: Obstetrics and Gynecology

## 2017-05-27 DIAGNOSIS — Z1231 Encounter for screening mammogram for malignant neoplasm of breast: Secondary | ICD-10-CM

## 2017-06-18 ENCOUNTER — Encounter: Payer: Self-pay | Admitting: *Deleted

## 2017-06-18 ENCOUNTER — Encounter: Payer: BLUE CROSS/BLUE SHIELD | Attending: Obstetrics and Gynecology | Admitting: *Deleted

## 2017-06-18 DIAGNOSIS — E282 Polycystic ovarian syndrome: Secondary | ICD-10-CM | POA: Insufficient documentation

## 2017-06-18 DIAGNOSIS — R7309 Other abnormal glucose: Secondary | ICD-10-CM

## 2017-06-18 DIAGNOSIS — Z713 Dietary counseling and surveillance: Secondary | ICD-10-CM | POA: Diagnosis present

## 2017-06-18 NOTE — Progress Notes (Signed)
Medical Nutrition Therapy:  Appt start time: 0915 end time:  2353.   Assessment:  Primary concerns today: is here for nutrition counseling pertaining to referral for PCOS She has been trying to "lose weight" and is not making any progress.  Was diagnosed with PCOS 25 years ago, but got no education.  Things have gotten more challenging since about age 51.  Her weight is stable ish but she can't remember being this heavy.  GYN referred to Dr. Chalmers Cater.  A1C is 5.8% and was prescribed Metformin and told to follow a "diabetic diet".  Has not started that yet.  Has been on Metformin for about 5 days and it's going ok.     Not sleeping well.  Wakes up 1-2 times to use the bathroom.  Gets to bed later than she wants and wakes up earlier than she wants.  Trouble falling asleep too sometimes. Has thought about, but not pursued sleep apnea.  Is fatigued during the day.    Sometimes feels achey.  Saw rheumatologist, but everything was fine.  Has elevated CRP and always has.  Increased inflammation.   Curious about metabolic syndrome, metabolic health for her kids.    Preferred Learning Style:   No preference indicated   Learning Readiness:  Ready   MEDICATIONS: Metformin 500 mg   DIETARY INTAKE:  Usual eating pattern includes 3 meals and a couple snacks per day. "organic", low carb, high F/V, nuts, "clean" Eats out 1 day/week.  Rice pasta, lean meat  Avoided foods include raw tomatoes, brussel sprouts, peppers.   Limits caffeine  24-hr recall:  B ( AM): organic egg omlete with spinach, ham, slice of sprouted spelt bread, water  Snk ( AM): cup of nuts  L ( PM): salad with grilled chicken, strawberries, pecans, mandarins.  water Snk ( PM): celery an dhummus D ( PM): grilled chicken, sweet potatoes, mixed veggies, water Snk ( PM): none yesterday Beverages: water  Usual physical activity: walks daily.  2 days weight training, 3 cards cardio     Nutritional Diagnosis:  [place]-2.1 Inpaired  nutrition utilization As related to carbohydrates.  As evidenced by PCOS.    Intervention:  Nutrition counseling provided.  Discussed physiology of carbohydrate metabolism and how it is affected by PCOS.  Discussed hormonal imbalances associated with PCOS (namely insulin resistance) and how those imbalances present themselves with hirsutism, body acne, menstrual irregularity, weight gain, and poor glycemic control.  Dicussed the importance of nutrition management for overall health.   Discussed HAES approach and stressed importance of adequate nutrition.  Discussed metabolic effects of inadequate nutrition and how weight management is related to hormone levels, not necessarily lifestyle behaviors.  Recommended talking with medical provider about adequate medication treatment of PCOS.  Most woman with PCOS benefit from 1500- 2000mg  Metformin, regardless of A1C.  Mentioned supplemental treatment with Ovasitol and DHA.  Recommended adequate calories and adequate protein.  Discussed importance of self-care, including sleep hygiene and gentle body movement   Teaching Method Utilized:  Visual Auditory   Handouts given during visit include:  PCOS Tips  PCOS Resources  What is Inositol?  Barriers to learning/adherence to lifestyle change: none  Demonstrated degree of understanding via:  Teach Back   Monitoring/Evaluation:  Dietary intake, exercise, labs,  prn.

## 2018-05-06 ENCOUNTER — Other Ambulatory Visit: Payer: Self-pay | Admitting: Obstetrics and Gynecology

## 2018-05-06 DIAGNOSIS — Z1231 Encounter for screening mammogram for malignant neoplasm of breast: Secondary | ICD-10-CM

## 2018-06-02 DIAGNOSIS — D229 Melanocytic nevi, unspecified: Secondary | ICD-10-CM

## 2018-06-02 HISTORY — DX: Melanocytic nevi, unspecified: D22.9

## 2018-06-03 ENCOUNTER — Ambulatory Visit
Admission: RE | Admit: 2018-06-03 | Discharge: 2018-06-03 | Disposition: A | Discharge status: Home / Self Care | Payer: BLUE CROSS/BLUE SHIELD | Source: Ambulatory Visit | Attending: Obstetrics and Gynecology | Admitting: Obstetrics and Gynecology

## 2018-06-03 ENCOUNTER — Ambulatory Visit: Payer: BLUE CROSS/BLUE SHIELD

## 2018-06-03 DIAGNOSIS — Z1231 Encounter for screening mammogram for malignant neoplasm of breast: Secondary | ICD-10-CM

## 2018-11-16 DIAGNOSIS — Z803 Family history of malignant neoplasm of breast: Secondary | ICD-10-CM | POA: Insufficient documentation

## 2019-04-28 ENCOUNTER — Other Ambulatory Visit: Payer: Self-pay | Admitting: Obstetrics and Gynecology

## 2019-04-28 DIAGNOSIS — Z1231 Encounter for screening mammogram for malignant neoplasm of breast: Secondary | ICD-10-CM

## 2019-06-23 ENCOUNTER — Other Ambulatory Visit: Payer: Self-pay

## 2019-06-23 ENCOUNTER — Ambulatory Visit
Admission: RE | Admit: 2019-06-23 | Discharge: 2019-06-23 | Disposition: A | Discharge status: Home / Self Care | Payer: BC Managed Care – PPO | Source: Ambulatory Visit | Attending: Obstetrics and Gynecology | Admitting: Obstetrics and Gynecology

## 2019-06-23 DIAGNOSIS — Z1231 Encounter for screening mammogram for malignant neoplasm of breast: Secondary | ICD-10-CM

## 2019-12-02 ENCOUNTER — Other Ambulatory Visit: Payer: Self-pay

## 2019-12-02 DIAGNOSIS — Z20822 Contact with and (suspected) exposure to covid-19: Secondary | ICD-10-CM

## 2019-12-03 LAB — NOVEL CORONAVIRUS, NAA: SARS-CoV-2, NAA: NOT DETECTED

## 2020-01-01 ENCOUNTER — Ambulatory Visit: Payer: Self-pay | Attending: Internal Medicine

## 2020-05-30 ENCOUNTER — Other Ambulatory Visit: Payer: Self-pay | Admitting: Obstetrics and Gynecology

## 2020-05-30 DIAGNOSIS — Z1231 Encounter for screening mammogram for malignant neoplasm of breast: Secondary | ICD-10-CM

## 2020-06-23 ENCOUNTER — Other Ambulatory Visit: Payer: Self-pay

## 2020-06-23 ENCOUNTER — Ambulatory Visit
Admission: RE | Admit: 2020-06-23 | Discharge: 2020-06-23 | Disposition: A | Discharge status: Home / Self Care | Payer: BC Managed Care – PPO | Source: Ambulatory Visit | Attending: Obstetrics and Gynecology | Admitting: Obstetrics and Gynecology

## 2020-06-23 DIAGNOSIS — Z1231 Encounter for screening mammogram for malignant neoplasm of breast: Secondary | ICD-10-CM

## 2020-09-12 ENCOUNTER — Ambulatory Visit: Payer: Self-pay | Admitting: Dermatology

## 2020-09-20 ENCOUNTER — Ambulatory Visit (INDEPENDENT_AMBULATORY_CARE_PROVIDER_SITE_OTHER): Payer: BC Managed Care – PPO | Admitting: Family Medicine

## 2020-09-20 ENCOUNTER — Other Ambulatory Visit: Payer: Self-pay

## 2020-09-20 DIAGNOSIS — E282 Polycystic ovarian syndrome: Secondary | ICD-10-CM | POA: Diagnosis not present

## 2020-09-20 DIAGNOSIS — E669 Obesity, unspecified: Secondary | ICD-10-CM

## 2020-09-20 NOTE — Patient Instructions (Addendum)
Goals:  1. When struggling with a  food decision, always ask ALL THREE questions (not one or two):  (a) What am I in the mood for?  (b) How hungry am I?  (c) What's good for me? Keep in mind that "what is good for you" can be interpreted in very different ways depending on the circumstances.  At one time the answer might be an apple, while at another time, the answer might be a scoop of ice cream.  The best food decision includes optimizing satisfaction.    2. Rate your hunger level pre- and post-meals.    3. Limit starchy foods to TWO per meal and ONE per snack. ONE portion of a starchy food is equal to the following:   - ONE slice of bread (or its equivalent, such as half of a hamburger bun).   - 1/2 cup of a "scoopable" starchy food such as potatoes or rice.   - 15 grams of Total Carbohydrate as shown on food label.   - Every 4 ounces of a sweet drink (including fruit juice).  4. Incorporate at least 2 (~20 minutes) strength training workouts weekly.  I will email some available appointment times for a follow-up video visit.

## 2020-09-20 NOTE — Progress Notes (Signed)
Telehealth Encounter PCP: Ranae Pila, PA at [facility] ([place])  Therapist: Jeremy Johann, [place], [facility], [place] I connected with Tineshia Becraft (MRN [id number]) on 09/20/2020 by MyChart video-enabled, HIPAA-compliant telemedicine application, verified that I was speaking with the correct person using two identifiers, and that the patient was in a private environment conducive to confidentiality.  The patient agreed to proceed.  Provider was Kennith Center, PhD, RD, LDN, CEDRD Provider was located at [facility] during this telehealth encounter; patient was at work  Appt start time: 1600 end time: 1700 (1 hour)  Reason for telehealth visit: Referred by therapist Jeremy Johann for Medical Nutrition Therapy related to weight management and PCOS.    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  Cassadee has a h/o PCOS and obesity.  Has "always" had elevated CRP.  She was seen by RD Lynden Ang in 2018 for PCOS and A1c of 5.8 at that time.  Has seen RD York Ram extensively, exploring nutritional aspects of PCOS and body acceptance.  Lyana has been talking with Jeremy Johann about intuitive eating.  She started metformin in 2016, but discontinued it in March 2021 when she started a Berberine trial through the [facility].  She is currently in her second 27-month trial.  With no intentional dietary changes, her A1c was stable and cholesterol level fell during the first 6 months.   Kambra said she seldom experiences hunger.    Assessment:  Usual eating pattern: 3 meals and 0-1 snack per day. Frequent foods and beverages: hot tea with cream, sweet tea (if eating out), water; eggs, bacon/ham, chx, veg's, sandwiches, fat-free milk, yogurt, cottage cheese.  Avoided foods: grapefruit, raw tomatoes, peppers, spicy foods.   Usual physical activity: walks the dog 1 mile twice a day ~4 X wk and at least 1 weekend day.    Most recent A1c: 5.6; Usual FBG is 110-120.   Sleep: Estimates average of 6-7 hours of sleep/night. Diagnosed with sleep apnea ~3 yrs ago.  Uses CPAP every night.   24-hr recall:  (Up at 6:15 AM; walked dog) B (6:45 AM)-  1/2 Fairlife protein shake (16 g protein) B (7:30 AM)-  1 ham & chs omelette (2 eggs), water, 20 oz hot tea, 3-4 packets stevia, ~3 tbsp cream Snk ( AM)-  --- L ( PM)-  1 chx salad sandw, chips, water (lunch mtg) Snk ( PM)-  --- D ( PM)-  1 pork chop, 3/4 c potatoes & onions, 1/2 c creamed corn, 1/2 c mixed veg's, 1/2 c apple sauce, 1/2 c skim milk Snk ( PM)-  1/2 Fairlife protein shake (16 g protein)  Typical day? Yes.     Intervention: Completed diet and exercise history, and established behavioral goals.   For recommendations and goals, see Patient Instructions.    Follow-up: TBD  Alisha Burgo,[name]

## 2020-10-19 ENCOUNTER — Other Ambulatory Visit: Payer: Self-pay

## 2020-10-19 ENCOUNTER — Ambulatory Visit: Payer: BC Managed Care – PPO | Admitting: Dermatology

## 2020-10-19 DIAGNOSIS — Z1283 Encounter for screening for malignant neoplasm of skin: Secondary | ICD-10-CM | POA: Diagnosis not present

## 2020-10-19 DIAGNOSIS — D229 Melanocytic nevi, unspecified: Secondary | ICD-10-CM

## 2020-10-19 DIAGNOSIS — D1800 Hemangioma unspecified site: Secondary | ICD-10-CM

## 2020-10-19 DIAGNOSIS — D2362 Other benign neoplasm of skin of left upper limb, including shoulder: Secondary | ICD-10-CM | POA: Diagnosis not present

## 2020-10-19 DIAGNOSIS — D2261 Melanocytic nevi of right upper limb, including shoulder: Secondary | ICD-10-CM

## 2020-10-19 DIAGNOSIS — L719 Rosacea, unspecified: Secondary | ICD-10-CM

## 2020-10-19 DIAGNOSIS — Z86018 Personal history of other benign neoplasm: Secondary | ICD-10-CM

## 2020-10-19 DIAGNOSIS — L814 Other melanin hyperpigmentation: Secondary | ICD-10-CM

## 2020-10-19 DIAGNOSIS — L918 Other hypertrophic disorders of the skin: Secondary | ICD-10-CM

## 2020-10-19 DIAGNOSIS — D239 Other benign neoplasm of skin, unspecified: Secondary | ICD-10-CM

## 2020-10-19 DIAGNOSIS — D2372 Other benign neoplasm of skin of left lower limb, including hip: Secondary | ICD-10-CM

## 2020-10-19 DIAGNOSIS — Z87898 Personal history of other specified conditions: Secondary | ICD-10-CM

## 2020-10-19 NOTE — Patient Instructions (Signed)

## 2020-10-19 NOTE — Progress Notes (Signed)
Follow-Up Visit   Subjective  Cynthia Mills is a 54 y.o. 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  She has a h/o mild rosacea- just gets redness, no bumps, sunscreen helps.  She also has several spots to check, no changes.   The following portions of the chart were reviewed this encounter and updated as appropriate:      Review of Systems:  No other skin or systemic complaints except as noted in HPI or Assessment and Plan.  Objective  Well appearing patient in no apparent distress; mood and affect are within normal limits.  A full examination was performed including scalp, head, eyes, ears, nose, lips, neck, chest, axillae, abdomen, back, buttocks, bilateral upper extremities, bilateral lower extremities, hands, feet, fingers, toes, fingernails, and toenails. All findings within normal limits unless otherwise noted below.  Objective  Left Shoulder - Anterior, L medial pretibia, R wrist: Firm pink/brown papulenodule with dimple sign 1.2cm, also smaller lesions on R wrist, L medial pretibial  Objective  Right Upper Arm: 3.0 x 2.14mm speckled brown macule, no change per pt  Objective  Left lat foot: Blue macule 2.64mm  Objective  face: Erythema cheeks  Objective  Right spinal mid upper back: Scar with no evidence of recurrence.    Assessment & Plan    Melanocytic Nevi - Tan-brown and/or pink-flesh-colored symmetric macules and papules - Benign appearing on exam today - Observation - Call clinic for new or changing moles - Recommend daily use of broad spectrum spf 30+ sunscreen to sun-exposed areas.   Lentigines - Scattered tan macules - Discussed due to sun exposure - Benign, observe - Call for any changes  Hemangiomas - Red papules - Discussed benign nature - Observe - Call for any changes  Skin cancer screening performed today.  Acrochordons (Skin Tags) - Fleshy,  skin-colored pedunculated papules - Benign appearing.  - Observe. - If desired, they can be removed with an in office procedure that is not covered by insurance. - Please call the clinic if you notice any new or changing lesions. - Axilla  Dermatofibroma Left Shoulder - Anterior, L medial pretibia, R wrist  Benign growth possibly related to trauma, such as an insect bite.  Discussed removal (shave vrs excision), resulting scar and risk of recurrence. Since not bothersome, will observe for now.   Nevus Right Upper Arm  Benign-appearing.  Observation.  Call clinic for new or changing moles.  Recommend daily use of broad spectrum spf 30+ sunscreen to sun-exposed areas.    Blue nevus Left lat foot  Benign-appearing.  Observation.  Call clinic for new or changing moles.  Recommend daily use of broad spectrum spf 30+ sunscreen to sun-exposed areas.    Rosacea face  Mild, discussed chronic condition, redness can be difficult to treat.  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 Observe for recurrence. Call clinic for new or changing lesions.  Recommend regular skin exams, daily broad-spectrum spf 30+ sunscreen use, and photoprotection.     Return in about 1 year (around 10/19/2021) for TBSE, hx of Atypical Nevus.  I, Othelia Pulling, RMA, am acting as scribe for Brendolyn Patty, MD . Documentation: I have reviewed the above documentation for accuracy and completeness, and I agree with the  above.  Brendolyn Patty MD

## 2020-10-25 ENCOUNTER — Ambulatory Visit (INDEPENDENT_AMBULATORY_CARE_PROVIDER_SITE_OTHER): Payer: BC Managed Care – PPO | Admitting: Family Medicine

## 2020-10-25 ENCOUNTER — Other Ambulatory Visit: Payer: Self-pay

## 2020-10-25 DIAGNOSIS — E282 Polycystic ovarian syndrome: Secondary | ICD-10-CM

## 2020-10-25 DIAGNOSIS — E669 Obesity, unspecified: Secondary | ICD-10-CM | POA: Diagnosis not present

## 2020-10-25 NOTE — Progress Notes (Signed)
Telehealth Encounter PCP: Ranae Pila, PA at [facility] ([place])  Therapist: Jeremy Johann, [place], [facility], [place] I connected with Branda Chaudhary (MRN [id number]) on 10/25/2020 by MyChart video-enabled, HIPAA-compliant telemedicine application, verified that I was speaking with the correct person using two identifiers, and that the patient was in a private environment conducive to confidentiality.  The patient agreed to proceed.  Provider was Kennith Center, PhD, RD, LDN, CEDRD Provider was located at [facility] during this telehealth encounter; patient was at work  Appt start time: 1600 end time: 1700 (1 hour)  Reason for telehealth visit: Referred by therapist Jeremy Johann for Medical Nutrition Therapy related to weight management and PCOS.    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  She has a h/o PCOS and obesity.  Has "always" had elevated CRP.  She was seen by RD Lynden Ang in 2018 for PCOS and A1c of 5.8 at that time.  Has seen RD York Ram extensively, exploring nutritional aspects of PCOS and body acceptance.  Lamyah has been talking with Jeremy Johann about intuitive eating.  She started metformin in 2016, but discontinued it in March 2021 when she started a Berberine trial through the [facility].  She is currently in her second 63-month trial.  With no intentional dietary changes, her A1c was stable and cholesterol level fell during the first 6 months.      Assessment: Finlay has used the "3 Qs of a good food decision," and has realized she does not always notice hunger until she feels ravenous.  Although we did not do a food recall, she has been making an effort to limit carb portions to two per meal at most meals.  Has not incorporated strength training to usual routine.   Usual eating pattern: 3 meals and 0-1 snack per day. Usual physical activity: Walks the dog 1 mile twice a day ~4 X wk and  at least 1 weekend day.    Intervention: Completed diet and exercise history, and established behavioral goals.  Recommended exploring options for starting strength training, and also suggested resources, Self-compassion.org and The Four Tendencies book.    For recommendations and goals, see Patient Instructions.    Follow-up: 4 weeks.   Lendell Gallick,[name]

## 2020-10-25 NOTE — Patient Instructions (Addendum)
Explore options for incorporating strength training of some kind to your weekly routine:  What can you do at home?  Do you want to consult with a personal trainer or online program?  What equipment do you need?  Suggestions: The Four Tendencies by Clint Bolder Self-compassion.org   Goals:  1. When struggling with a food decision, always ask ALL THREE questions (not one or two):  (a) What am I in the mood for?  (b) How hungry am I?  (c) What's good for me? Keep in mind that "what is good for you" can be interpreted in very different ways depending on the circumstances.  At one time the answer might be an apple, while at another time, the answer might be a scoop of ice cream.  The best food decision includes optimizing satisfaction.    2. Go no more than 5 hours between eating episodes.  Think about your meal or snack opportunities for the next day, e.g., Where will I be at lunch time tomorrow?; When will it be feasible to break for lunch? When will I eat?  3. Limit starchy foods to TWO per meal and ONE per snack. ONE portion of a starchy food is equal to the following:              - ONE slice of bread (or its equivalent, such as half of a hamburger bun).              - 1/2 cup of a "scoopable" starchy food such as potatoes or rice.              - 15 grams of Total Carbohydrate as shown on food label.              - Every 4 ounces of a sweet drink (including fruit juice).  4. Check (and record) fasting blood glucose at least once a week.    Follow-up: Video visit on Tuesday, Nov 30 at 4 PM.

## 2020-11-21 NOTE — Progress Notes (Signed)
Telehealth Encounter PCP: Ranae Pila, PA at [facility] ([place])  Therapist: Jeremy Johann, [place], [facility], [place] I connected with Jaonna Word (MRN [id number]) on 11/22/2020 by MyChart video-enabled, HIPAA-compliant telemedicine application, verified that I was speaking with the correct person using two identifiers, and that the patient was in a private environment conducive to confidentiality.  The patient agreed to proceed.  Provider was Kennith Center, PhD, RD, LDN, CEDRD Provider was located at [facility] during this telehealth encounter; patient was at work  Appt start time: 1600 end time: 1700 (1 hour)  Reason for telehealth visit: Referred by therapist Jeremy Johann for Medical Nutrition Therapy related to weight management and PCOS.    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  She has a h/o PCOS and obesity.  Has "always" had elevated CRP.  She was seen by RD Lynden Ang in 2018 for PCOS and A1c of 5.8 at that time.  Has seen RD York Ram extensively, exploring nutritional aspects of PCOS and body acceptance.  Nakya has been talking with Jeremy Johann about intuitive eating.  She started metformin in 2016, but discontinued it in March 2021 when she started a Berberine trial through the [facility].  She is currently in her second 56-month trial.  With no intentional dietary changes, her A1c was stable and cholesterol level fell during the first 6 months.      Assessment: Zinnia has been especially busy with the end of the semester approaching, so was apologetic she'd not managed to follow up on all suggestions at last appt.  She has continued to limit carb portions to two per meal at most meals.  Has not started strength training, although she found a body-positive personal trainer in [place] ([employer]) whom she plans to contact.  Shirline took the self-compassion quiz, with the  following results:  Overall: 2.85 Self-kindness: 2.20       Self-judg: 3.60  Common Hum: 2.75     Isolation: 2.25 Mindfulness: 2.75          Over-ID: 2.75  Usual eating pattern: 3 meals and 0-1 snack per day. Usual physical activity: Walks the dog 1 mile twice a day ~4 X wk and at least 1 weekend day.   24-hr recall:  B ( AM)-  Bagel sandwich w/ 1 fried egg, ham, chs Snk ( AM)-  --- L ( PM)-  Ham sandw w/ let, mayo, clementine Snk ( PM)-  --- D ( PM)-  Ham, butternut squash, potato, grn beans, mixed veg's, water Snk ( PM)-  1 S'more square  Typical day? Yes.     Intervention: Completed diet and exercise history, and established behavioral goals.  Reiterated recommendation for strength training, The Four Tendencies book, and encouraged further exploration of Self-compassion.org.  Also reviewed slides from Behavior Change class 2, which included discussion of motivation, willpower, belief, and self-compassion in behavior change.    Handouts given during visit: Patient Instructions Slides from Behavior Change Class 2 (pdf)  For recommendations and goals, see Patient Instructions.    Follow-up: 8 weeks.   Jameir Ake,[name]

## 2020-11-22 ENCOUNTER — Other Ambulatory Visit: Payer: Self-pay

## 2020-11-22 ENCOUNTER — Ambulatory Visit (INDEPENDENT_AMBULATORY_CARE_PROVIDER_SITE_OTHER): Payer: BC Managed Care – PPO | Admitting: Family Medicine

## 2020-11-22 DIAGNOSIS — E282 Polycystic ovarian syndrome: Secondary | ICD-10-CM | POA: Diagnosis not present

## 2020-11-22 DIAGNOSIS — E669 Obesity, unspecified: Secondary | ICD-10-CM

## 2020-11-22 NOTE — Patient Instructions (Addendum)
Goals:  1. Using a simple checkmark system on a calendar or in a notebook, document your progress on limiting starchy foods to TWO per meal and ONE per snack.  ONE portion of a starchy food is equal to the following:              - ONE slice of bread (or its equivalent, such as half of a hamburger bun).              - 1/2 cup of a "scoopable" starchy food such as potatoes or rice.              - 15 grams of Total Carbohydrate as shown on food label.              - 4 ounces of a sweet drink (including fruit juice). 2. Include in your documentation if choices were especially hard or easy today.   3. Check (and record) fasting blood glucose at least once a week.    In addition:  Conservator, museum/gallery at [employer], and make a plan for beginning strength training of some kind.   - Further explore the articles and/or videos at Self-compassion.org  - Buy the book, The Four Tendencies by Clint Bolder and take the quiz. - Email a progress report no later than December 30, 2020.    Follow-up: Video visit on Thursday, January 27 at 4 PM.   Safe travels, and enjoy the holidays!

## 2021-01-19 ENCOUNTER — Ambulatory Visit (INDEPENDENT_AMBULATORY_CARE_PROVIDER_SITE_OTHER): Payer: BC Managed Care – PPO | Admitting: Family Medicine

## 2021-01-19 ENCOUNTER — Other Ambulatory Visit: Payer: Self-pay

## 2021-01-19 DIAGNOSIS — E282 Polycystic ovarian syndrome: Secondary | ICD-10-CM | POA: Diagnosis not present

## 2021-01-19 DIAGNOSIS — E669 Obesity, unspecified: Secondary | ICD-10-CM | POA: Diagnosis not present

## 2021-01-19 NOTE — Patient Instructions (Addendum)
Insulin sensitivity tends to decline through the day.  This means it's best to get more of your carbohydrate early in the day.  (See 2017 article provided today.)  Think about this as you plan meals and choose foods throughout the day.  For example, some carb for breakfast makes good sense, since your insulin may be functioning best at that time, and blood glucose is likely to be relatively low after an overnight fast.  Your low-carb lunch yesterday might have been better at dinner time (without the sweet tea!), with the dinner pasta being consumed at lunch time when insulin may have been more sensitive.    This also suggests going back to a previous recommendation to limit carb portions to two per meal and one per snack, especially at dinnertime or later.  Reminder: ONE portion of a starchy food is equal to the following:  - ONE slice of bread (or its equivalent, such as half of a hamburger bun).  - 1/2 cup of a "scoopable" starchy food such as potatoes or rice.  - 15 grams of Total Carbohydrate as shown on food label.  - 4 ounces of a sweet drink (including fruit juice).  Goals:  1. Plan (write down!) at least 4 home strength workouts.  Keep out weights and any other equipment you need, so it's easier and more convenient to work out (and you have the visual reminders).      - Feel free to email me your workout ideas if you would like some feedback.   2. Walk at least 20 minutes 3 X wk.       - Look ahead at your week, and schedule your walk on phone with a reminder alert.       - Schedule with your walking partner as often as possible - well ahead of time.    For both walking and strength workouts, practice positive self-talk, such as "get to" vs. "have to."  In addition, give some thought to what you can put in place to make your walking/workouts as easy, convenient, and attractive as possible.  For example, download some good music or podcasts for  times you are exercising alone; lay out your exercise clothes the day before.    DOCUMENT your progress on both of the above goals (as well as adhering to <2 carb portions/meal, if you choose).  If you are not sure how you want to monitor progress, experiment with different options (e.g., write in planner or calendar, keep a document on the computer/in your phone, use a specific form), and let's talk about it at follow-up.    Follow-up: Video visit on Thursday, March 3 at 4 PM.

## 2021-01-19 NOTE — Progress Notes (Signed)
Telehealth Encounter PCP: Ranae Pila, PA at [facility] ([place])  Therapist: Jeremy Johann, [place], [facility], [place] I connected with Trenika Hudson (MRN [id number]) on 01/19/2021 by MyChart video-enabled, HIPAA-compliant telemedicine application, verified that I was speaking with the correct person using two identifiers, and that the patient was in a private environment conducive to confidentiality.  The patient agreed to proceed.  Persons participating in visit were patient and provider (registered dietitian) Kennith Center, PhD, RD, LDN, CEDRD.  Provider was located at [place] during this telehealth encounter; patient was at work.  Appt start time: 1600 end time: 1700 (1 hour)  Reason for telehealth visit: Referred by therapist Jeremy Johann for Medical Nutrition Therapy related to weight management and PCOS.    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  She has a h/o PCOS and obesity.  Has "always" had elevated CRP.  She was seen by RD Lynden Ang in 2018 for PCOS and A1c of 5.8 at that time.  Has seen RD York Ram extensively, exploring nutritional aspects of PCOS and body acceptance.  Giselle has been talking with Jeremy Johann about intuitive eating.  She started metformin in 2016, but discontinued it in March 2021 when she started a Berberine trial through the [facility].  She is currently in her second 37-month trial.  With no intentional dietary changes, her A1c was stable and cholesterol level fell during the first 6 months.      Assessment: At last appt, we again discussed recommendation for strength training, to read The Four Tendencies book, and for further exploration of Self-compassion.org.  Carra has not managed to follow through on all of these, for a number of reasons (but does have The Four Tendencies book).  Work has been stressful, especially in the context of COVID.  Overall, she feels  "weary."  She is still interested in starting some weight training, but is not enthusiastic about going to a gym during the pandemic.    Self-compassion quiz results, Nov 2021:  Overall: 2.85 Self-kindness: 2.20       Self-judg: 3.60  Common Hum: 2.75     Isolation: 2.25 Mindfulness: 2.75          Over-ID: 2.75  Usual eating pattern: 3 meals and 0-1 snack per day. Usual physical activity: Has walked the dog less often recently d/t weather.   Sleep: Estimates she is getting ~6 hrs/night.  Sleep has been inconsistent d/t business of work and family life; sometimes difficulty falling or staying asleep.   24-hr recall:  (Up at 6:30 AM) B (7:20 AM)-  2 eggs, 1 slc bacon, 1 slc thin toast, few strawber's, 16 oz hot tea, 1-2 tbsp ff cream, 3 Stevia, water Snk ( AM)-   L (1 PM)-  1 salad, strawber's, 2 tbsp feta, cashews, 3-4 oz chx, 1-2 tbsp drsng, sweet tea Snk ( PM)-  --- D (7 PM)-  1 c tortellini, 3 oz chx, side salad, strawber's, mandarin oranges, 1 slc garlic bread, 1 c sk milk   Snk ( PM)-  2 GS Thin Mint cookies Typical day? Yes.    Intervention: Completed diet and exercise history, and established some new behavioral goals.  Also discussed declining insulin sensitivity late in the day, and ways in which she can promote meeting exercise goals.    For recommendations and goals, see Patient Instructions.    Follow-up: Telehealth appt in 5 weeks.   Davonte Siebenaler,[name]

## 2021-02-20 ENCOUNTER — Encounter: Payer: Self-pay | Admitting: Podiatry

## 2021-02-20 ENCOUNTER — Other Ambulatory Visit: Payer: Self-pay

## 2021-02-20 ENCOUNTER — Ambulatory Visit: Payer: BC Managed Care – PPO | Admitting: Podiatry

## 2021-02-20 ENCOUNTER — Ambulatory Visit (INDEPENDENT_AMBULATORY_CARE_PROVIDER_SITE_OTHER): Payer: BC Managed Care – PPO

## 2021-02-20 DIAGNOSIS — M722 Plantar fascial fibromatosis: Secondary | ICD-10-CM | POA: Diagnosis not present

## 2021-02-20 DIAGNOSIS — M7661 Achilles tendinitis, right leg: Secondary | ICD-10-CM | POA: Diagnosis not present

## 2021-02-20 DIAGNOSIS — K589 Irritable bowel syndrome without diarrhea: Secondary | ICD-10-CM | POA: Insufficient documentation

## 2021-02-20 DIAGNOSIS — E282 Polycystic ovarian syndrome: Secondary | ICD-10-CM | POA: Insufficient documentation

## 2021-02-20 DIAGNOSIS — S86011A Strain of right Achilles tendon, initial encounter: Secondary | ICD-10-CM

## 2021-02-20 DIAGNOSIS — N6009 Solitary cyst of unspecified breast: Secondary | ICD-10-CM | POA: Insufficient documentation

## 2021-02-20 DIAGNOSIS — E785 Hyperlipidemia, unspecified: Secondary | ICD-10-CM | POA: Insufficient documentation

## 2021-02-20 DIAGNOSIS — M7989 Other specified soft tissue disorders: Secondary | ICD-10-CM

## 2021-02-20 DIAGNOSIS — R87610 Atypical squamous cells of undetermined significance on cytologic smear of cervix (ASC-US): Secondary | ICD-10-CM | POA: Insufficient documentation

## 2021-02-20 NOTE — Progress Notes (Signed)
Subjective:  Patient ID: Cynthia Mills, female    DOB: 08-14-1966,  MRN: [id number] HPI Chief Complaint  Patient presents with  . Foot Pain    Plantar heel bilateral/posterior heel right - patient has history of plantar fasciitis, better lately, had shockwave treatment by Dr. Paulla Dolly 55 years old, now having more trouble with the achilles-Ortho treated-steroids, NSAIDS, PT, would like a 2nd opinion  . New Patient (Initial Visit)    Est pt 2016    55 y.o. female presents with the above complaint.   ROS: Denies fever chills nausea vomiting muscle aches pains calf pain back pain chest pain shortness of breath.  Past Medical History:  Diagnosis Date  . Allergic rhinitis   . Atypical mole 06/02/2018   right spinal mid upper back   . Hyperlipemia   . PCOS (polycystic ovarian syndrome)   . Plantar fasciitis   . Vitamin D deficiency    Past Surgical History:  Procedure Laterality Date  . COLONOSCOPY WITH PROPOFOL N/A 01/09/2016   Procedure: COLONOSCOPY WITH PROPOFOL;  Surgeon: Manya Silvas, MD;  Location: [facility] ENDOSCOPY;  Service: Endoscopy;  Laterality: N/A;  . ESOPHAGOGASTRODUODENOSCOPY (EGD) WITH PROPOFOL N/A 01/09/2016   Procedure: ESOPHAGOGASTRODUODENOSCOPY (EGD) WITH PROPOFOL;  Surgeon: Manya Silvas, MD;  Location: [facility] ENDOSCOPY;  Service: Endoscopy;  Laterality: N/A;    Current Outpatient Medications:  .  B Complex-C (B-COMPLEX WITH VITAMIN C) tablet, Take 1 tablet by mouth daily., Disp: , Rfl:  .  Barberry-Oreg Grape-Goldenseal (BERBERINE COMPLEX PO), Take 500 mg by mouth in the morning, at noon, and at bedtime., Disp: , Rfl:  .  cetirizine (ZYRTEC) 10 MG tablet, Take by mouth., Disp: , Rfl:  .  cholecalciferol (VITAMIN D) 1000 units tablet, Take 1,000 Units by mouth daily., Disp: , Rfl:  .  Inositol-D Chiro-Inositol (OVASITOL) 2000-50 MG PACK, Take 1 tablet by mouth in the morning and at bedtime., Disp: , Rfl:  .  Multiple Vitamin (MULTI-VITAMINS) TABS, Take  by mouth., Disp: , Rfl:  .  omeprazole (PRILOSEC) 40 MG capsule, Take 40 mg by mouth daily., Disp: , Rfl:   No Known Allergies Review of Systems Objective:  There were no vitals filed for this visit.  General: Well developed, nourished, in no acute distress, alert and oriented x3   Dermatological: Skin is warm, dry and supple bilateral. Nails x 10 are well maintained; remaining integument appears unremarkable at this time. There are no open sores, no preulcerative lesions, no rash or signs of infection present.  Vascular: Dorsalis Pedis artery and Posterior Tibial artery pedal pulses are 2/4 bilateral with immedate capillary fill time. Pedal hair growth present. No varicosities and no lower extremity edema present bilateral.   Neruologic: Grossly intact via light touch bilateral. Vibratory intact via tuning fork bilateral. Protective threshold with Semmes Wienstein monofilament intact to all pedal sites bilateral. Patellar and Achilles deep tendon reflexes 2+ bilateral. No Babinski or clonus noted bilateral.   Musculoskeletal: No gross boney pedal deformities bilateral. No pain, crepitus, or limitation noted with foot and ankle range of motion bilateral. Muscular strength 5/5 in all groups tested bilateral.  Pain on medial lateral compression of the calcaneus.  She has pain on direct palpation medial calcaneal tubercle she has tenderness on palpation of the Achilles right.  Minimally so on the left.  No calf pain bilateral.  Gait: Unassisted, Nonantalgic.    Radiographs:  Radiographs taken today demonstrate an osseously mature individual with increased density in the posterior aspect of the  Achilles right with what appears to be fluid on the posterior aspect of the Achilles area.  No tear or rupture seen.  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  Also demonstrates some chronic appearing fasciitis.  The left  radiograph demonstrates minimal thickening of the Achilles and some soft tissue increase in density at the plantar fascial pain insertion site again appearing to be chronic.  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  Left foot most likely compensatory fasciitis.  Discussed etiology pathology conservative surgical therapies at this point time I would like to go ahead and get her scheduled for an MRI with contrast to better evaluate the borders of this lipoma and to make sure that it has not expanded out from his cortex.  Also evaluate for plantar fascial tear as well as an Achilles tendon tear.  No prescriptions were given I will follow-up with her once that comes in.      Cynthia Mills, [place]

## 2021-02-23 ENCOUNTER — Telehealth: Payer: BC Managed Care – PPO | Admitting: Family Medicine

## 2021-03-05 ENCOUNTER — Ambulatory Visit
Admission: RE | Admit: 2021-03-05 | Discharge: 2021-03-05 | Disposition: A | Discharge status: Home / Self Care | Payer: BC Managed Care – PPO | Source: Ambulatory Visit | Attending: Podiatry | Admitting: Podiatry

## 2021-03-05 DIAGNOSIS — S86011A Strain of right Achilles tendon, initial encounter: Secondary | ICD-10-CM

## 2021-03-05 DIAGNOSIS — M7989 Other specified soft tissue disorders: Secondary | ICD-10-CM

## 2021-03-05 DIAGNOSIS — M722 Plantar fascial fibromatosis: Secondary | ICD-10-CM

## 2021-03-05 MED ORDER — GADOBENATE DIMEGLUMINE 529 MG/ML IV SOLN
20.0000 mL | Freq: Once | INTRAVENOUS | Status: AC | PRN
  Administered 2021-03-05: 20 mL via INTRAVENOUS

## 2021-03-18 ENCOUNTER — Encounter: Payer: Self-pay | Admitting: Podiatry

## 2021-03-20 MED ORDER — MELOXICAM 15 MG PO TABS: 15.0000 mg | ORAL_TABLET | Freq: Every day | ORAL | 3 refills | Status: DC

## 2021-04-03 ENCOUNTER — Encounter: Payer: Self-pay | Admitting: Podiatry

## 2021-04-03 ENCOUNTER — Other Ambulatory Visit: Payer: Self-pay

## 2021-04-03 ENCOUNTER — Ambulatory Visit: Payer: BC Managed Care – PPO | Admitting: Podiatry

## 2021-04-03 DIAGNOSIS — M7661 Achilles tendinitis, right leg: Secondary | ICD-10-CM

## 2021-04-03 DIAGNOSIS — M722 Plantar fascial fibromatosis: Secondary | ICD-10-CM | POA: Diagnosis not present

## 2021-04-03 MED ORDER — NITROGLYCERIN 0.2 MG/HR TD PT24: 0.2000 mg | MEDICATED_PATCH | Freq: Every day | TRANSDERMAL | 12 refills | Status: DC

## 2021-04-03 NOTE — Progress Notes (Signed)
She presents today for follow-up of her MRI.  States that her right knee is really starting to hurt along with the right heel posteriorly and inferiorly.  Objective: Vital signs are stable she is alert oriented x3.  Pulses are palpable.  Posterior aspect of the Achilles insertion area is warm to the touch with a palpable bursa.  She also has some mild plantar fasciitis.  MRI does demonstrate insertional tear of the small at the Achilles insertion site.  Assessment: Planter fasciitis bursitis Achilles tendinitis with tear.  Right.  Plan: Discussed etiology pathology conservative versus surgical therapies with her today I discussed these in great detail with her and her husband at this point we are going to try the nonsurgical approach consisting of a nitroglycerin patch to the posterior heel going back in her cam walker and nonweightbearing.  We also discussed seeing the use of anti-inflammatories as well as shockwave therapy which she will be set up for in [place].  I also would like to have her casted the next time she is in [place].  May need to consider [place]az versus open surgery

## 2021-04-06 ENCOUNTER — Other Ambulatory Visit: Payer: BC Managed Care – PPO

## 2021-04-13 ENCOUNTER — Telehealth: Payer: BC Managed Care – PPO | Admitting: Family Medicine

## 2021-04-14 ENCOUNTER — Other Ambulatory Visit: Payer: Self-pay

## 2021-04-14 ENCOUNTER — Ambulatory Visit (INDEPENDENT_AMBULATORY_CARE_PROVIDER_SITE_OTHER): Payer: BC Managed Care – PPO

## 2021-04-14 DIAGNOSIS — M7661 Achilles tendinitis, right leg: Secondary | ICD-10-CM

## 2021-04-14 DIAGNOSIS — B351 Tinea unguium: Secondary | ICD-10-CM

## 2021-04-14 NOTE — Progress Notes (Signed)
Patient presents for the 1st EPAT treatment today with complaint of Right heel pain . Diagnosed with  by Dr. Milinda Pointer. This has been ongoing for several months. The patient has tried ice, stretching, NSAIDS and supportive shoe gear with no long term relief.   Most of the pain is located achilles tendon rihgt .  ESWT administered and tolerated well.Treatment settings initiated at:   Energy: 15  Ended treatment session today with 3000 shocks at the following settings:   Energy: 15  Frequency: 6.0  Joules: 12.66   Reviewed post EPAT instructions. Advised to avoid ice and NSAIDs throughout the treatment process and to utilize boot or supportive shoes for at least the next 3 days.  Follow up for 2nd treatment in 1 week.

## 2021-04-28 ENCOUNTER — Other Ambulatory Visit: Payer: Self-pay

## 2021-04-28 ENCOUNTER — Ambulatory Visit (INDEPENDENT_AMBULATORY_CARE_PROVIDER_SITE_OTHER): Payer: Self-pay

## 2021-04-28 DIAGNOSIS — B351 Tinea unguium: Secondary | ICD-10-CM

## 2021-04-28 DIAGNOSIS — M7661 Achilles tendinitis, right leg: Secondary | ICD-10-CM

## 2021-04-28 NOTE — Progress Notes (Signed)
Patient presents for the 2nd EPAT treatment today with complaint of Right heel pain . Diagnosed with  Achilles tendonitis by Dr. Milinda Pointer. This has been ongoing for several months. The patient has tried ice, stretching, NSAIDS and supportive shoe gear with no long term relief.   Most of the pain is located achilles tendon rihgt .  ESWT administered and tolerated well.Treatment settings initiated at:   Energy: 20  Ended treatment session today with 3000 shocks at the following settings:   Energy: 20  Frequency: 6.0  Joules: 19.62   Reviewed post EPAT instructions. Advised to avoid ice and NSAIDs throughout the treatment process and to utilize boot or supportive shoes for at least the next 3 days.  Follow up for 3rd treatment in 1 week.

## 2021-04-28 NOTE — Patient Instructions (Signed)

## 2021-05-03 ENCOUNTER — Telehealth: Payer: Self-pay | Admitting: Podiatry

## 2021-05-03 ENCOUNTER — Other Ambulatory Visit: Payer: BC Managed Care – PPO

## 2021-05-03 ENCOUNTER — Other Ambulatory Visit: Payer: Self-pay

## 2021-05-03 DIAGNOSIS — M722 Plantar fascial fibromatosis: Secondary | ICD-10-CM | POA: Diagnosis not present

## 2021-05-03 DIAGNOSIS — M7661 Achilles tendinitis, right leg: Secondary | ICD-10-CM | POA: Diagnosis not present

## 2021-05-03 NOTE — Telephone Encounter (Signed)
Called patient lvm to reschedule 5/12 appt to 5/13

## 2021-05-04 ENCOUNTER — Other Ambulatory Visit: Payer: BC Managed Care – PPO

## 2021-05-05 ENCOUNTER — Other Ambulatory Visit: Payer: BC Managed Care – PPO

## 2021-05-08 ENCOUNTER — Other Ambulatory Visit: Payer: Self-pay

## 2021-05-08 ENCOUNTER — Ambulatory Visit (INDEPENDENT_AMBULATORY_CARE_PROVIDER_SITE_OTHER): Payer: Self-pay

## 2021-05-08 DIAGNOSIS — M7661 Achilles tendinitis, right leg: Secondary | ICD-10-CM

## 2021-05-08 DIAGNOSIS — B351 Tinea unguium: Secondary | ICD-10-CM

## 2021-05-08 NOTE — Progress Notes (Signed)
Patient presents for the 3rd EPAT treatment today with complaint of Right heel pain . Diagnosed with  Achilles tendonitis by Dr. Milinda Pointer. This has been ongoing for several months. The patient has tried ice, stretching, NSAIDS and supportive shoe gear with no long term relief.   Most of the pain is located achilles tendon rihgt .  ESWT administered and tolerated well.Treatment settings initiated at:   Energy: 20  Ended treatment session today with 3000 shocks at the following settings:   Energy: 20  Frequency: 6.0  Joules: 19.62   Reviewed post EPAT instructions. Advised to avoid ice and NSAIDs throughout the treatment process and to utilize boot or supportive shoes for at least the next 3 days.  Follow up for 4th treatment in 2 weeks.

## 2021-05-24 ENCOUNTER — Other Ambulatory Visit: Payer: BC Managed Care – PPO

## 2021-05-29 ENCOUNTER — Telehealth: Payer: BC Managed Care – PPO | Admitting: Family Medicine

## 2021-05-29 ENCOUNTER — Ambulatory Visit: Payer: BC Managed Care – PPO | Admitting: Medical

## 2021-05-29 ENCOUNTER — Other Ambulatory Visit: Payer: Self-pay

## 2021-05-29 VITALS — BP 130/86 | HR 94 | Temp 97.9°F | Resp 18

## 2021-05-29 DIAGNOSIS — R490 Dysphonia: Secondary | ICD-10-CM

## 2021-05-29 DIAGNOSIS — J302 Other seasonal allergic rhinitis: Secondary | ICD-10-CM

## 2021-05-29 DIAGNOSIS — R0982 Postnasal drip: Secondary | ICD-10-CM

## 2021-05-29 DIAGNOSIS — Z1152 Encounter for screening for COVID-19: Secondary | ICD-10-CM

## 2021-05-29 LAB — POC COVID19 BINAXNOW: SARS Coronavirus 2 Ag: NEGATIVE

## 2021-05-29 MED ORDER — PREDNISONE 10 MG (21) PO TBPK: ORAL_TABLET | ORAL | 0 refills | Status: DC

## 2021-05-29 NOTE — Patient Instructions (Signed)
Eustachian Tube Dysfunction  Eustachian tube dysfunction refers to a condition in which a blockage develops in the narrow passage that connects the middle ear to the back of the nose (eustachian tube). The eustachian tube regulates air pressure in the middle ear by letting air move between the ear and nose. It also helps to drain fluid from the middle ear space. Eustachian tube dysfunction can affect one or both ears. When the eustachian tube does not function properly, air pressure, fluid, or both can build up in the middle ear. What are the causes? This condition occurs when the eustachian tube becomes blocked or cannot open normally. Common causes of this condition include:  Ear infections.  Colds and other infections that affect the nose, mouth, and throat (upper respiratory tract).  Allergies.  Irritation from cigarette smoke.  Irritation from stomach acid coming up into the esophagus (gastroesophageal reflux). The esophagus is the tube that carries food from the mouth to the stomach.  Sudden changes in air pressure, such as from descending in an airplane or scuba diving.  Abnormal growths in the nose or throat, such as: ? Growths that line the nose (nasal polyps). ? Abnormal growth of cells (tumors). ? Enlarged tissue at the back of the throat (adenoids). What increases the risk? You are more likely to develop this condition if:  You smoke.  You are overweight.  You are a child who has: ? Certain birth defects of the mouth, such as cleft palate. ? Large tonsils or adenoids. What are the signs or symptoms? Common symptoms of this condition include:  A feeling of fullness in the ear.  Ear pain.  Clicking or popping noises in the ear.  Ringing in the ear.  Hearing loss.  Loss of balance.  Dizziness. Symptoms may get worse when the air pressure around you changes, such as when you travel to an area of high elevation, fly on an airplane, or go scuba diving. How is  this diagnosed? This condition may be diagnosed based on:  Your symptoms.  A physical exam of your ears, nose, and throat.  Tests, such as those that measure: ? The movement of your eardrum (tympanogram). ? Your hearing (audiometry). How is this treated? Treatment depends on the cause and severity of your condition.  In mild cases, you may relieve your symptoms by moving air into your ears. This is called "popping the ears."  In more severe cases, or if you have symptoms of fluid in your ears, treatment may include: ? Medicines to relieve congestion (decongestants). ? Medicines that treat allergies (antihistamines). ? Nasal sprays or ear drops that contain medicines that reduce swelling (steroids). ? A procedure to drain the fluid in your eardrum (myringotomy). In this procedure, a small tube is placed in the eardrum to:  Drain the fluid.  Restore the air in the middle ear space. ? A procedure to insert a balloon device through the nose to inflate the opening of the eustachian tube (balloon dilation). Follow these instructions at home: Lifestyle  Do not do any of the following until your health care provider approves: ? Travel to high altitudes. ? Fly in airplanes. ? Work in a pressurized cabin or room. ? Scuba dive.  Do not use any products that contain nicotine or tobacco, such as cigarettes and e-cigarettes. If you need help quitting, ask your health care provider.  Keep your ears dry. Wear fitted earplugs during showering and bathing. Dry your ears completely after. General instructions  Take over-the-counter   and prescription medicines only as told by your health care provider.  Use techniques to help pop your ears as recommended by your health care provider. These may include: ? Chewing gum. ? Yawning. ? Frequent, forceful swallowing. ? Closing your mouth, holding your nose closed, and gently blowing as if you are trying to blow air out of your nose.  Keep all  follow-up visits as told by your health care provider. This is important. Contact a health care provider if:  Your symptoms do not go away after treatment.  Your symptoms come back after treatment.  You are unable to pop your ears.  You have: ? A fever. ? Pain in your ear. ? Pain in your head or neck. ? Fluid draining from your ear.  Your hearing suddenly changes.  You become very dizzy.  You lose your balance. Summary  Eustachian tube dysfunction refers to a condition in which a blockage develops in the eustachian tube.  It can be caused by ear infections, allergies, inhaled irritants, or abnormal growths in the nose or throat.  Symptoms include ear pain, hearing loss, or ringing in the ears.  Mild cases are treated with maneuvers to unblock the ears, such as yawning or ear popping.  Severe cases are treated with medicines. Surgery may also be done (rare). This information is not intended to replace advice given to you by your health care provider. Make sure you discuss any questions you have with your health care provider. Document Revised: 04/01/2018 Document Reviewed: 04/01/2018 Elsevier Patient Education  2021 [place] [employer]; [place] [employer] of Respiratory Medicine (7th ed., pp. [phone]). Elsevier.">  Postnasal Drip Postnasal drip is the feeling of mucus going down the back of your throat. Mucus is a slimy substance that moistens and cleans your nose and throat, as well as the air pockets in face bones near your forehead and cheeks (sinuses). Small amounts of mucus pass from your nose and sinuses down the back of your throat all the time. This is normal. When you produce too much mucus or the mucus gets too thick, you can feel it. Some common causes of postnasal drip include:  Having more mucus because of: ? A cold or the flu. ? Allergies. ? Cold air. ? Certain medicines.  Having more mucus that is thicker because of: ? A sinus or nasal  infection. ? Dry air. ? A food allergy. Follow these instructions at home: Relieving discomfort  Gargle with a salt-water mixture 3-4 times a day or as needed. To make a salt-water mixture, completely dissolve -1 tsp of salt in 1 cup of warm water.  If the air in your home is dry, use a humidifier to add moisture to the air.  Use a saline spray or container (neti pot) to flush out the nose (nasal irrigation). These methods can help clear away mucus and keep the nasal passages moist.   General instructions  Take over-the-counter and prescription medicines only as told by your health care provider.  Follow instructions from your health care provider about eating or drinking restrictions. You may need to avoid caffeine.  Avoid things that you know you are allergic to (allergens), like dust, mold, pollen, pets, or certain foods.  Drink enough fluid to keep your urine pale yellow.  Keep all follow-up visits as told by your health care provider. This is important. Contact a health care provider if:  You have a fever.  You have a sore throat.  You have difficulty swallowing.  You have headache.  You have sinus pain.  You have a cough that does not go away.  The mucus from your nose becomes thick and is green or yellow in color.  You have cold or flu symptoms that last more than 10 days. Summary  Postnasal drip is the feeling of mucus going down the back of your throat.  If your health care provider approves, use nasal irrigation or a nasal spray 2?4 times a day.  Avoid things that you know you are allergic to (allergens), like dust, mold, pollen, pets, or certain foods. This information is not intended to replace advice given to you by your health care provider. Make sure you discuss any questions you have with your health care provider. Document Revised: 09/20/2020 Document Reviewed: 09/20/2020 Elsevier Patient Education  2021 [place]. Allergic Rhinitis,  Adult Allergic rhinitis is a reaction to allergens. Allergens are things that can cause an allergic reaction. This condition affects the lining inside the nose (mucous membrane). There are two types of allergic rhinitis:  Seasonal. This type is also called hay fever. It happens only during some times of the year.  Perennial. This type can happen at any time of the year. This condition cannot be spread from person to person (is not contagious). It can be mild, worse, or very bad. It can develop at any age and may be outgrown. What are the causes? This condition may be caused by:  Pollen from grasses, trees, and weeds.  Dust mites.  Smoke.  Mold.  Car fumes.  The pee (urine), spit, or dander of pets. Dander is dead skin cells from a pet.   What increases the risk? You are more likely to develop this condition if:  You have allergies in your family.  You have problems like allergies in your family. You may have: ? Swelling of parts of your eyes and eyelids. ? Asthma. This affects how you breathe. ? Long-term redness and swelling on your skin. ? Food allergies. What are the signs or symptoms? The main symptom of this condition is a runny or stuffy nose (nasal congestion). Other symptoms may include:  Sneezing or coughing.  Itching and tearing of your eyes.  Mucus that drips down the back of your throat (postnasal drip).  Trouble sleeping.  Feeling tired.  Headache.  Sore throat. How is this treated? There is no cure for this condition. You should avoid things that you are allergic to. Treatment can help to relieve symptoms. This may include:  Medicines that block allergy symptoms, such as corticosteroids or antihistamines. These may be given as a shot, nasal spray, or pill.  Avoiding things you are allergic to.  Medicines that give you bits of what you are allergic to over time. This is called immunotherapy. It is done if other treatments do not help. You may  get: ? Shots. ? Medicine under your tongue.  Stronger medicines, if other treatments do not help. Follow these instructions at home: Avoiding allergens Find out what things you are allergic to and avoid them. To do this, try these things:  If you get allergies any time of year: ? Replace carpet with wood, tile, or vinyl flooring. Carpet can trap pet dander and dust. ? Do not smoke. Do not allow smoking in your home. ? Change your heating and air conditioning filters at least once a month.  If you get allergies only some times of the year: ? Keep windows closed when you can. ? Plan things  to do outside when pollen counts are lowest. Check pollen counts before you plan things to do outside. ? When you come indoors, change your clothes and shower before you sit on furniture or bedding.   If you are allergic to a pet: ? Keep the pet out of your bedroom. ? Vacuum, sweep, and dust often.   General instructions  Take over-the-counter and prescription medicines only as told by your doctor.  Drink enough fluid to keep your pee (urine) pale yellow.  Keep all follow-up visits as told by your doctor. This is important. Where to find more information  American Academy of Allergy, Asthma & Immunology: www.aaaai.org Contact a doctor if:  You have a fever.  You get a cough that does not go away.  You make whistling sounds when you breathe (wheeze).  Your symptoms slow you down.  Your symptoms stop you from doing your normal things each day. Get help right away if:  You are short of breath. This symptom may be an emergency. Do not wait to see if the symptom will go away. Get medical help right away. Call your local emergency services (911 in the [place] Do not drive yourself to the hospital. Summary  Allergic rhinitis may be treated by taking medicines and avoiding things you are allergic to.  If you have allergies only some of the year, keep windows closed when you can at those  times.  Contact your doctor if you get a fever or a cough that does not go away. This information is not intended to replace advice given to you by your health care provider. Make sure you discuss any questions you have with your health care provider. Document Revised: 02/01/2020 Document Reviewed: 12/08/2019 Elsevier Patient Education  2021 [employer].

## 2021-05-29 NOTE — Progress Notes (Signed)
Subjective:    Patient ID: Cynthia Mills, female    DOB: 24-Feb-1966, 55 y.o.   MRN: [id number]  HPI 55 yo female in non acuate distress. Tested for Covid-19 which was negateve brought inton the clinic and then brought to room for evaluation. 9 days ago , felt allergy watery eyes, itchy nose.   Zyrtec  Added Flonase two weeks.  Ear pressure no decreased   Clear discharge Blood pressure 130/86, pulse 94, temperature 97.9 F (36.6 C), temperature source Oral, resp. rate 18, SpO2 98 %.  No Known Allergies  Review of Systems  Constitutional: Negative for chills and fatigue.  HENT: Positive for postnasal drip, sneezing and sore throat. Negative for congestion, ear pain, sinus pressure, sinus pain and tinnitus.   Respiratory: Positive for cough (clear) and chest tightness. Negative for shortness of breath and wheezing.   Cardiovascular: Negative for chest pain.  Gastrointestinal: Negative for abdominal pain and diarrhea.  Musculoskeletal: Negative for myalgias.  Allergic/Immunologic: Positive for environmental allergies.  Neurological: Positive for headaches. Negative for dizziness, syncope and light-headedness.  history of achilles tendon tear on right foot presents with walking boot.     Objective:   Physical Exam Vitals and nursing note reviewed.  Constitutional:      Appearance: Normal appearance. She is obese.  HENT:     Head: Normocephalic and atraumatic.     Jaw: There is normal jaw occlusion.     Right Ear: Ear canal and external ear normal. A middle ear effusion is present. There is no impacted cerumen.     Left Ear: Ear canal and external ear normal. A middle ear effusion is present.     Nose: Congestion (mild) present.     Mouth/Throat:     Lips: Pink.     Mouth: Mucous membranes are moist.     Pharynx: Oropharynx is clear.  Eyes:     Extraocular Movements: Extraocular movements intact.     Conjunctiva/sclera: Conjunctivae normal.     Pupils: Pupils  are equal, round, and reactive to light.  Cardiovascular:     Rate and Rhythm: Normal rate and regular rhythm.     Heart sounds: Normal heart sounds.  Pulmonary:     Effort: Pulmonary effort is normal.     Breath sounds: Normal breath sounds.  Musculoskeletal:        General: Normal range of motion.     Cervical back: Normal range of motion and neck supple.  Skin:    General: Skin is warm and dry.  Neurological:     General: No focal deficit present.     Mental Status: She is alert and oriented to person, place, and time.  Psychiatric:        Mood and Affect: Mood normal.        Behavior: Behavior normal.        Thought Content: Thought content normal.        Judgment: Judgment normal.      Covid-19 test Negaative     Assessment & Plan:  Encounter  For Covid-19  Screening  , Seasonal allergies PND Hoarse voice most likely from PND Switch up non sedative antihistimine to Allegra, you may have developed a tolerance to Zyrtec. Use OTC Flonase daily per package instructions. Drink plenty of water to help thin out mucus. Eustachian tube dysfunction bilaterally Meds ordered this encounter  Medications  . predniSONE (STERAPRED UNI-PAK 21 TAB) 10 MG (21) TBPK tablet    Sig: Take 6 tablets  by mouth today then 5 tablets tomorrow then one less tablet every day there after.    Dispense:  21 tablet    Refill:  0  note she is using nitroglycerin for her achillies tendon tear , not cardiac. Follow up with your doctor in  3-10 days if symptoms are not improving.Patient verbalizes understandign and has no questions at discharge.

## 2021-05-30 ENCOUNTER — Ambulatory Visit: Payer: BC Managed Care – PPO | Admitting: Medical

## 2021-06-12 ENCOUNTER — Ambulatory Visit (INDEPENDENT_AMBULATORY_CARE_PROVIDER_SITE_OTHER): Payer: BC Managed Care – PPO | Admitting: Family Medicine

## 2021-06-12 ENCOUNTER — Other Ambulatory Visit: Payer: Self-pay | Admitting: Physician Assistant

## 2021-06-12 ENCOUNTER — Other Ambulatory Visit: Payer: Self-pay

## 2021-06-12 DIAGNOSIS — Z713 Dietary counseling and surveillance: Secondary | ICD-10-CM

## 2021-06-12 DIAGNOSIS — E669 Obesity, unspecified: Secondary | ICD-10-CM

## 2021-06-12 DIAGNOSIS — E282 Polycystic ovarian syndrome: Secondary | ICD-10-CM

## 2021-06-12 DIAGNOSIS — Z1231 Encounter for screening mammogram for malignant neoplasm of breast: Secondary | ICD-10-CM

## 2021-06-12 NOTE — Progress Notes (Signed)
Telehealth Encounter (Email link to [email or web address] ) PCP: Ranae Pila, PA at [facility] ([place])  Therapist: Jeremy Johann, [place], [facility], [place] I connected with Peggie Hornak (MRN [id number]) on 06/12/2021 by MyChart video-enabled, HIPAA-compliant telemedicine application, verified that I was speaking with the correct person using two identifiers, and that the patient was in a private environment conducive to confidentiality.  The patient agreed to proceed.  Persons participating in visit were patient and provider (registered dietitian) Kennith Center, PhD, RD, LDN, CEDRD.  Provider was located at [place] during this telehealth encounter; patient was at work.  Appt start time: 1530 end time: 1630 (1 hour)  Reason for telehealth visit: Referred by therapist Jeremy Johann for Medical Nutrition Therapy related to weight management and PCOS.    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  She has a h/o PCOS and obesity.  Has "always" had elevated CRP.  She was seen by RD Lynden Ang in 2018 for PCOS and A1c of 5.8 at that time.  Has seen RD York Ram extensively, exploring nutritional aspects of PCOS and body acceptance.  Sherrilyn has been talking with Jeremy Johann about intuitive eating.  She started metformin in 2016, but discontinued it in March 2021 when she started a Berberine trial through the [facility].  Kinzi has continued taking Berberine, and her A1c has held pretty steady, while not causing GI problems she experienced with metformin.   Assessment: Bronwyn has been feeling overwhelmed with work, and has decided to transition to a different position, which she hopes will ease some of the stress.  Since last fall, she has been seeing an orthopedist for ankle and knee pain.  A course of PT and oral steroids for Achilles tendinitis did not resolved the problem.  Recent MRI revealed an Achilles tear,  so she has been in an orthopedic boot since late April.  At a PCP appt in April, she heard re. the "1-pound diet" for weight loss.  Dr. Carrie Mew insisted Lucienne Minks needed to lose 2 lb/week by the next appt.  At the F/U appt, Dr. Carrie Mew recommended weight loss medication, which Jasmin wanted to discuss at today's appt.       Usual eating pattern: 3 meals and 0-1 snack per day. Usual physical activity: Limited PT (for core and quads), but unable to do more b/c of orthopedist boot.     Sleep: Estimates she is getting 6 hrs/night.   24-hr recall:  (Up at 8 AM) B (8:30 AM)-  1/4 protein shake B (9:30 AM)-  2 slc Fr toast, 2 bacon, 4 chunks cantaloupe, coffee, 1 tbsp diet creamer Snk ( AM)-  --- L (2 PM)-  1 [employer] drumstick, 1/2 biscuit, 12 oz swt tea Snk ( PM)-  --- D (6:30 PM)-  1 c bbq, 1/2 c green beans, 1/4 c swt pot casserole, 12 oz swt tea Snk ( PM)-  --- Typical day? No.  Intervention: Completed diet and exercise history, and established some new behavioral goals.  Also discussed declining insulin sensitivity late in the day, and ways in which she can promote meeting exercise goals.    For recommendations and goals, see Patient Instructions.    Follow-up: Telehealth appt in 3 weeks.   Rafal Archuleta,[name]

## 2021-06-12 NOTE — Patient Instructions (Signed)
Goals:  1.  Limit carb portions to two per meal and one per snack, especially at dinnertime or later.  Reminder: ONE portion of a starchy food is equal to the following:             - ONE slice of bread (or its equivalent, such as half of a hamburger bun).             - 1/2 cup of a "scoopable" starchy food such as potatoes or rice.             - 15 grams of Total Carbohydrate as shown on food label.             - 4 ounces of a sweet drink (including fruit juice).  2. For the next 2 weeks, check and record fasting blood glucose levels.  If you see an especially high or low value, immediately write down what, when, and how much you last ate.    Regarding use of GLP-1 agonists, the appetite-suppressing effects are not likely to be much benefit to you b/c you don't typically consume large portions.  The metabolic effects, however, might be useful - helping to increase metabolic rate, as well as minimize fat storage.  Whatever you decide about use of these medications, I strongly recommend that you work on limiting overall carb intake, choose high-quality carb's, and taper carb intake through the day to consume less later in the day.    At our next appt, we'll talk about carb quality and glycemic responses of different carbs.     Follow-up: Video visit on Thursday, July 14 at 3:30 PM.

## 2021-06-16 ENCOUNTER — Other Ambulatory Visit: Payer: BC Managed Care – PPO

## 2021-06-20 ENCOUNTER — Other Ambulatory Visit: Payer: Self-pay | Admitting: Podiatry

## 2021-06-21 ENCOUNTER — Telehealth: Payer: Self-pay | Admitting: Podiatry

## 2021-06-21 NOTE — Telephone Encounter (Signed)
Please advise 

## 2021-06-21 NOTE — Telephone Encounter (Signed)
Pt left message late yesterday afternoon stating she had ordered orthotics over a month ago and has not heard anything.  Upon checking the orthotics are on order thru [facility].  I called pt and apologized but they have not come in yet and I am hoping for them to come in this week. Sorry for the delay. She also then asked about the epat as her last appt was cxled and I verified it is still not up and she will get a call to r/s that when it is back working.

## 2021-06-27 ENCOUNTER — Encounter: Payer: Self-pay | Admitting: Podiatry

## 2021-07-05 NOTE — Progress Notes (Signed)
Telehealth Encounter (Email link to [email or web address] ) PCP: Ranae Pila, PA at [facility] ([place])  Therapist: Jeremy Mills, [place], [facility], [place] I connected with Cynthia Mills (MRN [id number]) on 07/06/2021 by MyChart video-enabled, HIPAA-compliant telemedicine application, verified that I was speaking with the correct person using two identifiers, and that the patient was in a private environment conducive to confidentiality.  The patient agreed to proceed.  Persons participating in visit were patient and provider (registered dietitian) Kennith Center, PhD, RD, LDN, CEDRD.  Provider was located at [place] during this telehealth encounter; patient was at work.  Appt start time: 1530 end time: 1630 (1 hour)  Reason for telehealth visit: Referred by therapist Cynthia Mills for Medical Nutrition Therapy related to weight management and PCOS.    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  Has "always" had elevated CRP.  She was seen by RD Cynthia Mills in 2018 for PCOS and A1c of 5.8 at that time.  Has seen RD Cynthia Mills extensively, exploring nutritional aspects of PCOS and body acceptance.  Cynthia Mills has been talking with Cynthia Mills about intuitive eating.  She started metformin in 2016, but discontinued it in March 2021 when she started a Berberine trial through the [facility].  Cynthia Mills has continued taking Berberine, and her A1c has held pretty steady, while not causing GI problems she experienced with metformin.   Assessment: Cynthia Mills has not been able to d/c use of her orthopedic boot (ortho F/U  appt early Aug).  She is leaning toward using a weight loss medication (GLP-1 agonist); her PCP has initiated the process of getting prior approval for Ozempic.  Has not managed to adhere as closely to goals as she'd like, but schedule will get back to being consistent once she is  back to work on Aug 1.   Usual eating pattern: 3 meals and 0-1 snack per day. Usual physical activity: Limited PT (for core and quads) due to continued required boot.  Sleep: Estimates she is getting 6 hrs/night.   24-hr recall:  (Up at 8 AM) B (8:15 AM)-  12 oz [employer] pro shake, 1/2 light Eng muffin, 1/2 tbsp peanut butter, 1 clementine Snk ( AM)-  1 c hot tea, 3 stevia, 1 tbsp regular flavored creamer L (2:15 PM)-  1/2 leftover eggplant parmesan, water Snk ( PM)-  1 c hot tea, 3 stevia, 1 tbsp regular flavored creamer Snk (4 PM)-  [place] snack pack  D (9 PM)-  [place] [employer] sandwich on multigrain, 4 oz sweet tea Snk ( PM)-  --- Typical day? No. Usually eats lunch and dinner much earlier, and has more fruit and veg's.   Recent FBGs: 6/27 8 am  127 28 10 am  116 29 8:30  125 30 6:15  140 -  7/1 8:50  111 7/2 9:15  126 7/3 10:40    54 7/4 8:30  123 7/5 9:30     37 7/6 10:00  111 7/7 6:50  131 -  7/8 7:00  136 -  7/9 11:00  119 7/10 7:30  130 - 7/11 8:45  126 7/12 8:00    99 7/13 8:00  104  Intervention: Completed diet and exercise history, and reviewed past 17 days' FBG.    For recommendations and goals, see Patient Instructions.    Follow-up: Telehealth appt in 6 weeks.   Cynthia Mills,Cynthia Mills

## 2021-07-06 ENCOUNTER — Other Ambulatory Visit: Payer: Self-pay

## 2021-07-06 ENCOUNTER — Ambulatory Visit (INDEPENDENT_AMBULATORY_CARE_PROVIDER_SITE_OTHER): Payer: Self-pay | Admitting: Family Medicine

## 2021-07-06 DIAGNOSIS — E282 Polycystic ovarian syndrome: Secondary | ICD-10-CM

## 2021-07-06 DIAGNOSIS — E669 Obesity, unspecified: Secondary | ICD-10-CM

## 2021-07-06 NOTE — Patient Instructions (Addendum)
Fasting Blood Glucoses:  Once you get back to consistent bedtimes (& getting up times), check fasting BGs again for 1-2 weeks.  If you see one that is very low (<60), re-check.    GLP-1 agonist Ozempic is virtually the same medication as Wegovy, although available in a slightly lower dose at max dosage (2 vs. 2.4 mg).  You will start at a low dose (0.25 mg), and titrate up probably every 4 weeks.    Behavioral Goals:  1.  Limit carb portions to two per meal and one per snack, especially at dinnertime or later.  Reminder: ONE portion of a starchy food is equal to the following:             - ONE slice of bread (or its equivalent, such as half of a hamburger bun).             - 1/2 cup of a "scoopable" starchy food such as potatoes or rice.             - 15 grams of Total Carbohydrate as shown on food label.             - 4 ounces of a sweet drink (including fruit juice). 2. Obtain at least 20 grams of protein per meal.  (You get approximately 7 g protein in each 1 ounce of meat, fish, cheese, or poultry; in 1 egg, and 8 g protein in each 8 oz of milk; yogurts vary greatly, but [place] yogurt is highest in protein.)  Complete some meals using the Meal Planning form for "go-to" meals that are relatively quick and easy to prepare.  Use this form as a basis for shopping so you have these ingredients on hand.    At our next appt, we'll talk about carb quality and glycemic responses of different carbs.     Follow-up: Video visit on Monday, August 29 at 11 AM.

## 2021-07-07 ENCOUNTER — Ambulatory Visit (INDEPENDENT_AMBULATORY_CARE_PROVIDER_SITE_OTHER): Payer: Self-pay

## 2021-07-07 ENCOUNTER — Other Ambulatory Visit: Payer: Self-pay

## 2021-07-07 DIAGNOSIS — M7661 Achilles tendinitis, right leg: Secondary | ICD-10-CM

## 2021-07-07 DIAGNOSIS — B351 Tinea unguium: Secondary | ICD-10-CM

## 2021-07-07 NOTE — Progress Notes (Signed)
Patient presents for the 4th EPAT treatment today with complaint of Right heel pain . Diagnosed with  Achilles tendonitis by Dr. Milinda Pointer. This has been ongoing for several months. The patient has tried ice, stretching, NSAIDS and supportive shoe gear with no long term relief.   Most of the pain is located achilles tendon rihgt .  ESWT administered and tolerated well.Treatment settings initiated at:   Energy: 25  Ended treatment session today with 3000 shocks at the following settings:   Energy: 25  Frequency: 4.0  Joules: 24.52   Reviewed post EPAT instructions. Advised to avoid ice and NSAIDs throughout the treatment process and to utilize boot or supportive shoes for at least the next 3 days.  Follow up with Dr. Milinda Pointer in 6 weeks.

## 2021-07-24 ENCOUNTER — Ambulatory Visit: Payer: Self-pay | Admitting: Podiatry

## 2021-07-26 ENCOUNTER — Other Ambulatory Visit: Payer: Self-pay

## 2021-07-26 ENCOUNTER — Encounter: Payer: Self-pay | Admitting: Podiatry

## 2021-07-26 ENCOUNTER — Encounter: Payer: Self-pay | Admitting: *Deleted

## 2021-07-26 ENCOUNTER — Ambulatory Visit (INDEPENDENT_AMBULATORY_CARE_PROVIDER_SITE_OTHER): Payer: BC Managed Care – PPO | Admitting: Podiatry

## 2021-07-26 DIAGNOSIS — M7661 Achilles tendinitis, right leg: Secondary | ICD-10-CM | POA: Diagnosis not present

## 2021-07-26 MED ORDER — NITROGLYCERIN 0.2 MG/HR TD PT24: 0.2000 mg | MEDICATED_PATCH | Freq: Every day | TRANSDERMAL | 12 refills | Status: DC

## 2021-07-26 MED ORDER — MELOXICAM 15 MG PO TABS: 15.0000 mg | ORAL_TABLET | Freq: Every day | ORAL | 3 refills | Status: DC

## 2021-07-26 NOTE — Progress Notes (Signed)
She presents today for follow-up of her tendinitis some Achilles right.  She states that she was doing better with them the EPAT upwards of even 75% but never felt that she can take the boot off and walk.  The Pap machine broke before her fourth EPAT says she really does not know how she is going to be with it.  Objective: She still has pain on palpation with bursitis overlying the retrocalcaneal area.  Assessment: Bursitis Achilles tendinitis with small insertional site tear per MRI.  Plan: I provided her with information regarding EPAT and recommended that she start this back over at no charge since we were unable to complete it.  I will redispense her a new cam boot today.  I also let her talk with Dr. Sherryle Lis today regarding Topaz.  She will notify us as to which direction she would like to take.

## 2021-08-04 ENCOUNTER — Other Ambulatory Visit: Payer: Self-pay

## 2021-08-04 ENCOUNTER — Ambulatory Visit
Admission: RE | Admit: 2021-08-04 | Discharge: 2021-08-04 | Disposition: A | Discharge status: Home / Self Care | Payer: BC Managed Care – PPO | Source: Ambulatory Visit | Attending: Physician Assistant | Admitting: Physician Assistant

## 2021-08-04 DIAGNOSIS — Z1231 Encounter for screening mammogram for malignant neoplasm of breast: Secondary | ICD-10-CM

## 2021-08-16 ENCOUNTER — Ambulatory Visit: Payer: BC Managed Care – PPO | Admitting: Podiatry

## 2021-08-16 ENCOUNTER — Other Ambulatory Visit: Payer: Self-pay

## 2021-08-16 DIAGNOSIS — M7661 Achilles tendinitis, right leg: Secondary | ICD-10-CM | POA: Diagnosis not present

## 2021-08-20 NOTE — Progress Notes (Signed)
  Subjective:  Patient ID: Cynthia Mills, female    DOB: 1966-11-03,  MRN: [id number]  Chief Complaint  Patient presents with   Tendonitis    Follow up Achilles tendonitis right    55 y.o. female presents with the above complaint. History confirmed with patient.  Has not improved yet she been weighing her options of restarting EPAT versus proceeding with Topaz  Objective:  Physical Exam: warm, good capillary refill, no trophic changes or ulcerative lesions, normal DP and PT pulses, and normal sensory exam.  Right Foot: tenderness at Achilles tendon insertion  No images are attached to the encounter.  Study Result  Narrative & Impression  CLINICAL DATA:  Posterior ankle pain. Bone lesion noted on x-ray performed at outside facility.   EXAM: MRI OF THE RIGHT ANKLE WITHOUT AND WITH CONTRAST   TECHNIQUE: Multiplanar, multisequence MR imaging of the ankle was performed before and after the administration of intravenous contrast.   CONTRAST:  71m MULTIHANCE GADOBENATE DIMEGLUMINE 529 MG/ML IV SOLN   COMPARISON:  None.   FINDINGS: TENDONS   Peroneal: Peroneal longus tendon intact. Peroneal brevis intact.   Posteromedial: Posterior tibial tendon intact. Flexor hallucis longus tendon intact. Flexor digitorum longus tendon intact.   Anterior: Tibialis anterior tendon intact. Extensor hallucis longus tendon intact Extensor digitorum longus tendon intact.   Achilles: Moderate tendinosis of the Achilles tendon with a small interstitial tear just proximal to the calcaneal insertion. Mild retrocalcaneal bursitis.   Plantar Fascia: Intact.   LIGAMENTS   Lateral: Anterior talofibular ligament intact. Calcaneofibular ligament intact. Posterior talofibular ligament intact. Anterior and posterior tibiofibular ligaments intact.   Medial: Deltoid ligament intact. Spring ligament intact.   CARTILAGE   Ankle Joint: No joint effusion. Normal ankle mortise. No  chondral defect.   Subtalar Joints/Sinus Tarsi: Normal subtalar joints. No subtalar joint effusion. Normal sinus tarsi.   Bones: No marrow signal abnormality. No fracture or dislocation. No aggressive osseous lesion. 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   Soft Tissue: No fluid collection or hematoma. Muscles are normal without edema or atrophy. Tarsal tunnel is normal.   IMPRESSION: IMPRESSION 1. 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 No further evaluation recommended. 2. Moderate tendinosis of the Achilles tendon with a small interstitial tear just proximal to the calcaneal insertion. Mild retrocalcaneal bursitis.     Electronically Signed   By: HKathreen Devoid  On: 03/06/2021 09:38      1. Tendonitis, Achilles, right      Plan:  Patient was evaluated and treated and all questions answered.  We again reviewed her treatment thus far and treatment options.  We discussed the option of restarting EPAT versus proceeding with Topaz minimally invasive with micro debridement as well as a PRP injection.  We discussed the risk, benefits and potential complications of this.  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  She will return in early October to sign consent and have her final presurgical planning visit.  Return in 6 weeks (on 09/25/2021) for re-check Achilles tendon and plan for surgery on 10/21.

## 2021-08-21 ENCOUNTER — Ambulatory Visit (INDEPENDENT_AMBULATORY_CARE_PROVIDER_SITE_OTHER): Payer: BC Managed Care – PPO

## 2021-08-21 ENCOUNTER — Encounter: Payer: Self-pay | Admitting: Podiatry

## 2021-08-21 ENCOUNTER — Ambulatory Visit (INDEPENDENT_AMBULATORY_CARE_PROVIDER_SITE_OTHER): Payer: BC Managed Care – PPO | Admitting: Family Medicine

## 2021-08-21 ENCOUNTER — Other Ambulatory Visit: Payer: Self-pay

## 2021-08-21 DIAGNOSIS — Z713 Dietary counseling and surveillance: Secondary | ICD-10-CM

## 2021-08-21 DIAGNOSIS — E282 Polycystic ovarian syndrome: Secondary | ICD-10-CM

## 2021-08-21 DIAGNOSIS — M7661 Achilles tendinitis, right leg: Secondary | ICD-10-CM

## 2021-08-21 NOTE — Progress Notes (Signed)
Telehealth Encounter (Email link to [email or web address] ) PCP: Ranae Pila, PA at [facility] ([place])  Therapist: Jeremy Johann, [place], [facility], [place] I connected with Adda Hund (MRN [id number]) on 08/21/2021 by MyChart video-enabled, HIPAA-compliant telemedicine application, verified that I was speaking with the correct person using two identifiers, and that the patient was in a private environment conducive to confidentiality.  The patient agreed to proceed.  Persons participating in visit were patient and provider (registered dietitian) Kennith Center, PhD, RD, LDN, CEDRD.  Provider was located at [place] during this telehealth encounter; patient was at work.  Appt start time: 1530 end time: 1630 (1 hour)  Reason for telehealth visit: Referred by therapist Jeremy Johann for Medical Nutrition Therapy related to weight management and PCOS.    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  She has a h/o PCOS and obesity.  Has "always" had elevated CRP.  She was seen by RD Lynden Ang in 2018 for PCOS and A1c of 5.8 at that time.  Has seen RD York Ram extensively, exploring nutritional aspects of PCOS and body acceptance.  Quetzal has been talking with Jeremy Johann about intuitive eating.  She started metformin in 2016, but discontinued it in March 2021 when she started a Berberine trial through the [facility].  Jame has continued taking Berberine, and her A1c has held pretty steady, while not causing GI problems she experienced with metformin.   Assessment: Jame's Achiles tear has healed ~70%, according to her orthopedic's assessment in early August, so she is still in the orthopedic boot.  She is receiving electromagnetic transduction therapy now, but if not helpful she will have stem cell surgery in Oct, which would then require 6 wks of no activity again.  Her dad broke his hip last week, and  Jeanmaries's daughter left for college on the weekend.  All this on top of work restarting as of Aug 1, in her new position at [employer], teaching three new classes now.  She has been feeling exhausted in recent weeks, and has found it nearly impossible to focus on her own needs and health goals.   She has not started taking Ozempic b/c BCBS denied it b/c she is not diabetic.   Usual eating pattern: 3 meals and 0-1 snack per day. Usual physical activity: Still limited b/c of orthopedic boot.   Sleep: Estimates she is getting 6 hrs/night.   No food recall today.  Intervention: Completed diet and exercise history, and discussed best approach to meeting nutrition needs at this challenging time.    For recommendations and goals, see Patient Instructions.    Follow-up: Telehealth appt in 6 weeks.   Algie Cales,[name]

## 2021-08-21 NOTE — Progress Notes (Signed)
Patient presents for the 1st (2nd round) EPAT treatment today with complaint of Right heel pain . Diagnosed with  Achilles tendonitis by Dr. Milinda Pointer. This has been ongoing for several months. The patient has tried ice, stretching, NSAIDS and supportive shoe gear with no long term relief.   Most of the pain is located achilles tendon rihgt .  ESWT administered and tolerated well.Treatment settings initiated at:   Energy: 15  Ended treatment session today with 3000 shocks at the following settings:   Energy: 15  Frequency: 6.0  Joules: 14.72   Reviewed post EPAT instructions. Advised to avoid ice and NSAIDs throughout the treatment process and to utilize boot or supportive shoes for at least the next 3 days.  Follow up with Dr. Milinda Pointer in 6 weeks.

## 2021-08-21 NOTE — Patient Instructions (Addendum)
Complete the Meal Planning Form provided today.  Reminder: Your goal with the form is to design meals that (a) are  relatively quick and easy to prepare; (b) are well liked by you and your husband; and (c) meet your nutrition objectives.  Use this form as a basis for shopping, so you can make just about any meal on a given day.  (Post on the inside of a kitchen cupboard.)  Nutrition Objectives:  1.  Limit carb portions to two per meal and one per snack, especially at dinnertime or later.  Reminder: ONE portion of a starchy food is equal to the following:             - ONE slice of bread (or its equivalent, such as half of a hamburger bun).             - 1/2 cup of a "scoopable" starchy food such as potatoes or rice.             - 15 grams of Total Carbohydrate as shown on food label.             - 4 ounces of a sweet drink (including fruit juice). 2. Obtain at least 20 grams of protein per meal.  (You get approximately 7 g protein in each 1 ounce of meat, fish, cheese, or poultry; in 1 egg, and 8 g protein in each 8 oz of milk; yogurts vary greatly, but [place] yogurt is highest in protein.)   Follow-up: Video visit on Monday, Oct 10 at 11 AM.

## 2021-08-21 NOTE — Patient Instructions (Signed)

## 2021-08-30 ENCOUNTER — Ambulatory Visit (INDEPENDENT_AMBULATORY_CARE_PROVIDER_SITE_OTHER): Payer: BC Managed Care – PPO

## 2021-08-30 ENCOUNTER — Other Ambulatory Visit: Payer: Self-pay

## 2021-08-30 DIAGNOSIS — M7661 Achilles tendinitis, right leg: Secondary | ICD-10-CM

## 2021-08-30 NOTE — Progress Notes (Signed)
Patient presents for the 2nd (2nd round) EPAT treatment today with complaint of Right heel pain . Diagnosed with  Achilles tendonitis by Dr. Milinda Pointer. This has been ongoing for several months. The patient has tried ice, stretching, NSAIDS and supportive shoe gear with no long term relief.   Most of the pain is located achilles tendon rihgt .  ESWT administered and tolerated well.Treatment settings initiated at:   Energy: 20  Ended treatment session today with 3000 shocks at the following settings:   Energy: 20  Frequency: 5.0  Joules: 19.62   Reviewed post EPAT instructions. Advised to avoid ice and NSAIDs throughout the treatment process and to utilize boot or supportive shoes for at least the next 3 days.  Follow-up for EPAT #3 in 1 week.

## 2021-09-08 ENCOUNTER — Ambulatory Visit (INDEPENDENT_AMBULATORY_CARE_PROVIDER_SITE_OTHER): Payer: BC Managed Care – PPO

## 2021-09-08 ENCOUNTER — Other Ambulatory Visit: Payer: Self-pay

## 2021-09-08 DIAGNOSIS — M7661 Achilles tendinitis, right leg: Secondary | ICD-10-CM

## 2021-09-08 NOTE — Progress Notes (Signed)
Patient presents for the 3rd (2nd round) EPAT treatment today with complaint of Right heel pain . Diagnosed with  Achilles tendonitis by Dr. Milinda Pointer. This has been ongoing for several months. The patient has tried ice, stretching, NSAIDS and supportive shoe gear with no long term relief.   Most of the pain is located achilles tendon rihgt .  ESWT administered and tolerated well.Treatment settings initiated at:   Energy: 25  Ended treatment session today with 3000 shocks at the following settings:   Energy: 25  Frequency: 4.0  Joules: 24.52   Reviewed post EPAT instructions. Advised to avoid ice and NSAIDs throughout the treatment process and to utilize boot or supportive shoes for at least the next 3 days.  Follow-up for EPAT #4 in 2 weeks.

## 2021-09-11 DIAGNOSIS — B351 Tinea unguium: Secondary | ICD-10-CM

## 2021-09-13 ENCOUNTER — Telehealth: Payer: Self-pay | Admitting: Urology

## 2021-09-13 NOTE — Telephone Encounter (Signed)
DOS - 10/13/21  REPAIR ACHILLES TENDON RIGHT --- [id number]   BCBS EFFECTIVE DATE - 05/24/21   PLAN DEDUCTIBLE - $700.00 W/ $700.00 REMAINING OUT OF POCKET - $5,500.00 W/ $8,185.90 REMAINING COINSURANCE - 30% COPAY - $0.00   NO PRIOR AUTH IS REQUIRED

## 2021-09-25 ENCOUNTER — Other Ambulatory Visit: Payer: Self-pay

## 2021-09-25 ENCOUNTER — Ambulatory Visit: Payer: BC Managed Care – PPO | Admitting: Podiatry

## 2021-09-25 DIAGNOSIS — M7661 Achilles tendinitis, right leg: Secondary | ICD-10-CM

## 2021-09-25 NOTE — Progress Notes (Signed)
Subjective:  Patient ID: Cynthia Mills, female    DOB: Oct 09, 1966,  MRN: [id number]  Chief Complaint  Patient presents with   Tendonitis    Surgical planning    55 y.o. female returns for follow-up with the above complaint. History confirmed with patient.  She has had some improvement she has had 3 EPAT sessions since I last saw her  Objective:  Physical Exam: warm, good capillary refill, no trophic changes or ulcerative lesions, normal DP and PT pulses, and normal sensory exam.  Right Foot: Tenderness mostly in the mid substance the Achilles is improved quite a bit at the insertion  No images are attached to the encounter.  Study Result  Narrative & Impression  CLINICAL DATA:  Posterior ankle pain. Bone lesion noted on x-ray performed at outside facility.   EXAM: MRI OF THE RIGHT ANKLE WITHOUT AND WITH CONTRAST   TECHNIQUE: Multiplanar, multisequence MR imaging of the ankle was performed before and after the administration of intravenous contrast.   CONTRAST:  96mL MULTIHANCE GADOBENATE DIMEGLUMINE 529 MG/ML IV SOLN   COMPARISON:  None.   FINDINGS: TENDONS   Peroneal: Peroneal longus tendon intact. Peroneal brevis intact.   Posteromedial: Posterior tibial tendon intact. Flexor hallucis longus tendon intact. Flexor digitorum longus tendon intact.   Anterior: Tibialis anterior tendon intact. Extensor hallucis longus tendon intact Extensor digitorum longus tendon intact.   Achilles: Moderate tendinosis of the Achilles tendon with a small interstitial tear just proximal to the calcaneal insertion. Mild retrocalcaneal bursitis.   Plantar Fascia: Intact.   LIGAMENTS   Lateral: Anterior talofibular ligament intact. Calcaneofibular ligament intact. Posterior talofibular ligament intact. Anterior and posterior tibiofibular ligaments intact.   Medial: Deltoid ligament intact. Spring ligament intact.   CARTILAGE   Ankle Joint: No joint effusion.  Normal ankle mortise. No chondral defect.   Subtalar Joints/Sinus Tarsi: Normal subtalar joints. No subtalar joint effusion. Normal sinus tarsi.   Bones: No marrow signal abnormality. No fracture or dislocation. No aggressive osseous lesion. 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   Soft Tissue: No fluid collection or hematoma. Muscles are normal without edema or atrophy. Tarsal tunnel is normal.   IMPRESSION: IMPRESSION 1. 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 No further evaluation recommended. 2. Moderate tendinosis of the Achilles tendon with a small interstitial tear just proximal to the calcaneal insertion. Mild retrocalcaneal bursitis.     Electronically Signed   By: Kathreen Devoid   On: 03/06/2021 09:38      1. Tendonitis, Achilles, right       Plan:  Patient was evaluated and treated and all questions answered.  She is doing well and is actually made quite a bit of improvement.  She has further EPAT session scheduled.  Discussed proceeding with Topaz procedure and PRP and I think this point we can hold off on operative treatment at this point.  I would like to begin to transition her out of her cam boot into a supportive shoe with her orthotics and a felt heel lift.  Also begin physical therapy for rehab and I gave her a referral for [facility] physical therapy.  We should know in 3 to 4 weeks if it is improving, she will let me know how she is doing and we will consider an outpatient in office PRP injection  if it is not improving quickly enough.  If its not significantly better within 6 weeks or so we will plan to proceed with Topaz and intraoperative PRP in late November or early December.  Advised to avoid taking NSAIDs and discontinue the  meloxicam  Return in about 6 weeks (around 11/06/2021) for re-check Achilles tendon.

## 2021-09-29 ENCOUNTER — Other Ambulatory Visit: Payer: Self-pay

## 2021-09-29 ENCOUNTER — Ambulatory Visit (INDEPENDENT_AMBULATORY_CARE_PROVIDER_SITE_OTHER): Payer: Self-pay

## 2021-09-29 DIAGNOSIS — M7661 Achilles tendinitis, right leg: Secondary | ICD-10-CM

## 2021-09-29 DIAGNOSIS — B351 Tinea unguium: Secondary | ICD-10-CM

## 2021-09-29 NOTE — Progress Notes (Signed)
Patient presents for the 4th (2nd round) EPAT treatment today with complaint of Right heel pain . Diagnosed with  Achilles tendonitis by Dr. Milinda Pointer. This has been ongoing for several months. The patient has tried ice, stretching, NSAIDS and supportive shoe gear with no long term relief.   Most of the pain is located achilles tendon rihgt .  ESWT administered and tolerated well.Treatment settings initiated at:   Energy: 30  Ended treatment session today with 3000 shocks at the following settings:   Energy: 30  Frequency: 4.0  Joules: 29.43   Reviewed post EPAT instructions. Advised to avoid ice and NSAIDs throughout the treatment process and to utilize boot or supportive shoes for at least the next 3 days.  Currently being treated by Dr. Sherryle Lis.  Follow-up with Dr. Sherryle Lis in 6 weeks.

## 2021-10-02 ENCOUNTER — Other Ambulatory Visit: Payer: Self-pay

## 2021-10-02 ENCOUNTER — Ambulatory Visit (INDEPENDENT_AMBULATORY_CARE_PROVIDER_SITE_OTHER): Payer: BC Managed Care – PPO | Admitting: Family Medicine

## 2021-10-02 DIAGNOSIS — E282 Polycystic ovarian syndrome: Secondary | ICD-10-CM | POA: Diagnosis not present

## 2021-10-02 NOTE — Progress Notes (Signed)
Telehealth Encounter (Email link to [email or web address] ) PCP: Ranae Pila, PA at [facility] ([place])  Therapist: Jeremy Johann, [place], [facility], [place] I connected with Neah Sporrer (MRN [id number]) on 10/02/2021 by MyChart video-enabled, HIPAA-compliant telemedicine application, verified that I was speaking with the correct person using two identifiers, and that the patient was in a private environment conducive to confidentiality.  The patient agreed to proceed.  Persons participating in visit were patient and provider (registered dietitian) Kennith Center, PhD, RD, LDN, CEDRD.  Provider was located at [place] during this telehealth encounter; patient was at work.  Appt start time: 1100 end time: 1200 (1 hour)  Reason for telehealth visit: Referred by therapist Jeremy Johann for Medical Nutrition Therapy related to weight management and PCOS.    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  She has a h/o PCOS and obesity.  Has "always" had elevated CRP.  She was seen by RD Lynden Ang in 2018 for PCOS and A1c of 5.8 at that time.  Has seen RD York Ram extensively, exploring nutritional aspects of PCOS and body acceptance.  Madiline has been talking with Jeremy Johann about intuitive eating.  She started metformin in 2016, but discontinued it in March 2021 when she started a Berberine trial through the [facility].  Kailey has continued taking Berberine, and her A1c has held pretty steady, while not causing GI problems she experienced with metformin.   Assessment: Julio saw her orthopedist last week.  She finished the shockwave tx last week, and is now out of the ortho boot for parts of each evening, and is doing 10-min walks 3 X week, after which she ices her ankle.  Still sleeping in the boot.  Will start PT 10/18, and will see the orthopedist after 6 weeks.  Shilah has been focused on limiting carb  portions to <2 per meal, with most carb's earlier in the day.  She has also more consistently included a protein food at each meal, although appetite is poor on some days, making protein foods harder to include.   Usual eating pattern: 3 meals and 0-1 snack per day. Usual physical activity: Still limited b/c of orthopedic boot.   Sleep: Estimates she is getting 6 hrs/night.   24-hr recall:  (Up at 8:30 AM) B (9:30 AM)-  1/2 Fairlife pro shake (15 g pro, 80 kcal), cantaloupe, 2 cinn rolls, tea, stevia, diet creamer Snk ( AM)-  --- L (1 PM)-  4 oz ham&turkey sandw on thin bread, lett, 1 tsp mayo, 1 1/2 c grapes, tea, stevia, diet creamer Snk (3 PM)-  2 sticks beef jerkey D (6:30 PM)-  5 wings, 4-5 hot chips,10 celery, water Snk ( PM)-  2 c popcorn Typical day? No.  Usually has more water, has egg for bkfst 5-6 X wk, salad for lunch M-F, and takeout foods usually just 1 X wk (kids were home this week).   Intervention: Completed diet and exercise history, and discussed how "Obliger" tendency may be getting in the way of intended behaviors as Blessed often  prioritizes others' needs over her own.  Also discussed importance of making informed decisions vs. head-in-the-sand choices with respect to high-carb foods.    For recommendations and goals, see Patient Instructions.    Follow-up: Telehealth appt in 6 weeks.   Anysia Choi,[name]

## 2021-10-02 NOTE — Patient Instructions (Signed)
Nutrition Goals:  1.  Limit carb portions to two per meal and one per snack, especially at dinnertime or later.  Reminder: ONE portion of a starchy food is equal to the following:             - ONE slice of bread (or its equivalent, e.g., half of a hamburger bun).             - 1/2 cup of a "scoopable" starchy food such as potatoes or rice.             - 15 grams of Total Carbohydrate as shown on food label.             - 4 ounces of a sweet drink (including fruit juice). 2. Obtain at least 20 grams of protein per meal.   - 1 ounce of meat, fish, poultry, or cheese or 1 egg provides about 7 grams of protein.  - 8 oz of milk or 2 tbsp peanut butter provides 8 grams of protein. - 1 cup (8 oz) of most beans provides 15 grams of protein.    - Yogurts vary greatly, but [place] yogurt is highest in protein.    Review The Four Tendencies, and write down:  Specific actions you can take that will help you follow through with your intended behaviors.   Examples of incidents you notice that were influenced by your Obliger tendency.    Managing carb portions:   Determine the carb content of high-carb foods such as cinnamon rolls.  You may be surprised to learn that you can fit many of these foods into your meal while maintaining the carb limit you're aiming for (30 grams per carb foods per meal).  Clarification:  Your total carb intake at a given meal is likely to be >30 grams b/c there are small amounts of carb in vegetables, for example, but your goal is to limit the carb foods specifically to two portions (= 30 grams).  Meal planning:  Complete the Meal Planning Form provided at last appt.    Follow-up: Video visit on Monday, November 21 at 10 AM.

## 2021-10-18 ENCOUNTER — Encounter: Payer: BC Managed Care – PPO | Admitting: Podiatry

## 2021-10-24 ENCOUNTER — Ambulatory Visit: Payer: BC Managed Care – PPO | Admitting: Dermatology

## 2021-10-24 ENCOUNTER — Other Ambulatory Visit: Payer: Self-pay

## 2021-10-24 DIAGNOSIS — L719 Rosacea, unspecified: Secondary | ICD-10-CM | POA: Diagnosis not present

## 2021-10-24 DIAGNOSIS — L578 Other skin changes due to chronic exposure to nonionizing radiation: Secondary | ICD-10-CM | POA: Diagnosis not present

## 2021-10-24 DIAGNOSIS — L918 Other hypertrophic disorders of the skin: Secondary | ICD-10-CM

## 2021-10-24 DIAGNOSIS — D171 Benign lipomatous neoplasm of skin and subcutaneous tissue of trunk: Secondary | ICD-10-CM | POA: Diagnosis not present

## 2021-10-24 DIAGNOSIS — D229 Melanocytic nevi, unspecified: Secondary | ICD-10-CM

## 2021-10-24 DIAGNOSIS — D2362 Other benign neoplasm of skin of left upper limb, including shoulder: Secondary | ICD-10-CM

## 2021-10-24 DIAGNOSIS — Z86018 Personal history of other benign neoplasm: Secondary | ICD-10-CM

## 2021-10-24 DIAGNOSIS — Z1283 Encounter for screening for malignant neoplasm of skin: Secondary | ICD-10-CM

## 2021-10-24 DIAGNOSIS — D1800 Hemangioma unspecified site: Secondary | ICD-10-CM

## 2021-10-24 DIAGNOSIS — L821 Other seborrheic keratosis: Secondary | ICD-10-CM

## 2021-10-24 DIAGNOSIS — L814 Other melanin hyperpigmentation: Secondary | ICD-10-CM

## 2021-10-24 DIAGNOSIS — D2261 Melanocytic nevi of right upper limb, including shoulder: Secondary | ICD-10-CM

## 2021-10-24 DIAGNOSIS — D239 Other benign neoplasm of skin, unspecified: Secondary | ICD-10-CM

## 2021-10-24 DIAGNOSIS — D2372 Other benign neoplasm of skin of left lower limb, including hip: Secondary | ICD-10-CM

## 2021-10-24 MED ORDER — FINACEA 15 % EX FOAM: 1.0000 "application " | CUTANEOUS | 3 refills | Status: DC

## 2021-10-24 NOTE — Patient Instructions (Addendum)

## 2021-10-24 NOTE — Progress Notes (Signed)
Follow-Up Visit   Subjective  Cynthia Mills is a 55 y.o. female who presents for the following: Follow-up (Patient here today for full body exam. Patient reports no new spots of concerns today at follow up. ).  She has rosacea and uses Rhofade prn.  She thinks it is too strong and would like an alternative.  She has a fatty growth on her back that she wants checked.  Patient here for full body skin exam and skin cancer screening.  The following portions of the chart were reviewed this encounter and updated as appropriate:      Review of Systems: No other skin or systemic complaints except as noted in HPI or Assessment and Plan.   Objective  Well appearing patient in no apparent distress; mood and affect are within normal limits.  A full examination was performed including scalp, head, eyes, ears, nose, lips, neck, chest, axillae, abdomen, back, buttocks, bilateral upper extremities, bilateral lower extremities, hands, feet, fingers, toes, fingernails, and toenails. All findings within normal limits unless otherwise noted below.  face Erythema with telangectasia on cheeks   right upper arm 3 x 2 mm speckled brown macule   left lateral foot 2 mm blue macule   Right Upper Back Indistinct 7 cm soft rubbery mass  Assessment & Plan  Rosacea face  Persistent erythema  Rosacea is a chronic progressive skin condition usually affecting the face of adults, causing redness and/or acne bumps. It is treatable but not curable. It sometimes affects the eyes (ocular rosacea) as well. It may respond to topical and/or systemic medication and can flare with stress, sun exposure, alcohol, exercise and some foods.  Daily application of broad spectrum spf 30+ sunscreen to face is recommended to reduce flares.  Continue Rhofade to affected areas of face qAM as needed for redness   Start Finacea 15 % foam 1 - 2 times daily 50 g 3 rf   Azelaic Acid (FINACEA) 15 % FOAM - face Apply 1  application topically See admin instructions. Use 1 - 2 times daily as tolerated for face  Nevus right upper arm  Benign-appearing.  Stable. Observation.  Call clinic for new or changing lesions.  Recommend daily use of broad spectrum spf 30+ sunscreen to sun-exposed areas.    Blue nevus left lateral foot  Benign-appearing.  Stable. Observation.  Call clinic for new or changing lesions.  Recommend daily use of broad spectrum spf 30+ sunscreen to sun-exposed areas.    Lipoma of back Right Upper Back  Asymptomatic Benign, observe.    Recommend if becomes larger/bothersome can be removed by general surgeon     Lentigines - Scattered tan macules - Due to sun exposure - Benign-appearing, observe - Recommend daily broad spectrum sunscreen SPF 30+ to sun-exposed areas, reapply every 2 hours as needed. - Call for any changes  Seborrheic Keratoses - Stuck-on, waxy, tan-brown papules and/or plaques  - Benign-appearing - Discussed benign etiology and prognosis. - Observe - Call for any changes  Melanocytic Nevi - Tan-brown and/or pink-flesh-colored symmetric macules and papules - Benign appearing on exam today - Observation - Call clinic for new or changing moles - Recommend daily use of broad spectrum spf 30+ sunscreen to sun-exposed areas.   Dermatofibroma - 1.1 cm Firm pink/brown papulenodule with dimple sign left upper arm, no changes -  firm pink/brown papulenodule with dimple sign left pretibia , right knee, right ankle , right upper back, right forearm  - Benign appearing - Call for any  changes  Acrochordons (Skin Tags) - Fleshy, skin-colored pedunculated papules at neck  - Benign appearing.  - Observe. - If desired, they can be removed with an in office procedure that is not covered by insurance. - Please call the clinic if you notice any new or changing lesions.  Hemangiomas - Red papules - Discussed benign nature - Observe - Call for any changes  Actinic  Damage - Chronic condition, secondary to cumulative UV/sun exposure - diffuse scaly erythematous macules with underlying dyspigmentation - Recommend daily broad spectrum sunscreen SPF 30+ to sun-exposed areas, reapply every 2 hours as needed.  - Staying in the shade or wearing long sleeves, sun glasses (UVA+UVB protection) and wide brim hats (4-inch brim around the entire circumference of the hat) are also recommended for sun protection.  - Call for new or changing lesions.  History of Dysplastic Nevi - No evidence of recurrence today at right spinal mid upper back - Recommend regular full body skin exams - Recommend daily broad spectrum sunscreen SPF 30+ to sun-exposed areas, reapply every 2 hours as needed.  - Call if any new or changing lesions are noted between office visits  Skin cancer screening performed today.  Return for 1 year tbse . I, Ruthell Rummage, CMA, am acting as scribe for Brendolyn Patty, MD.  Documentation: I have reviewed the above documentation for accuracy and completeness, and I agree with the above.  Brendolyn Patty MD

## 2021-10-31 ENCOUNTER — Telehealth: Payer: Self-pay

## 2021-10-31 MED ORDER — AZELAIC ACID 15 % EX GEL: 1.0000 "application " | Freq: Two times a day (BID) | CUTANEOUS | 1 refills | Status: DC

## 2021-10-31 NOTE — Telephone Encounter (Signed)
RX sent in and patient advised of change.

## 2021-10-31 NOTE — Telephone Encounter (Signed)
Finacea Foam PA denied. [employer] wants a trial and failure to both Metronidazole Gel and Azelaic Acid Gel.

## 2021-11-01 ENCOUNTER — Encounter: Payer: BC Managed Care – PPO | Admitting: Podiatry

## 2021-11-06 ENCOUNTER — Other Ambulatory Visit: Payer: Self-pay

## 2021-11-06 ENCOUNTER — Ambulatory Visit: Payer: BC Managed Care – PPO | Admitting: Podiatry

## 2021-11-06 DIAGNOSIS — M7661 Achilles tendinitis, right leg: Secondary | ICD-10-CM

## 2021-11-06 NOTE — Progress Notes (Signed)
  Subjective:  Patient ID: Cynthia Mills, female    DOB: January 11, 1966,  MRN: [id number]  Chief Complaint  Patient presents with   Tendonitis    6 week follow up achilles tendonitis right    55 y.o. female returns for follow-up with the above complaint. History confirmed with patient.  Overall doing much better she has been doing well at physical therapy, back in regular shoes.  She had 1 additional EPAT session since I last saw her.  Objective:  Physical Exam: warm, good capillary refill, no trophic changes or ulcerative lesions, normal DP and PT pulses, and normal sensory exam.  Right Foot: Minimal tenderness in the Achilles, mostly in the mid substance  No images are attached to the encounter.  Study Result  Narrative & Impression  CLINICAL DATA:  Posterior ankle pain. Bone lesion noted on x-ray performed at outside facility.   EXAM: MRI OF THE RIGHT ANKLE WITHOUT AND WITH CONTRAST   TECHNIQUE: Multiplanar, multisequence MR imaging of the ankle was performed before and after the administration of intravenous contrast.   CONTRAST:  17mL MULTIHANCE GADOBENATE DIMEGLUMINE 529 MG/ML IV SOLN   COMPARISON:  None.   FINDINGS: TENDONS   Peroneal: Peroneal longus tendon intact. Peroneal brevis intact.   Posteromedial: Posterior tibial tendon intact. Flexor hallucis longus tendon intact. Flexor digitorum longus tendon intact.   Anterior: Tibialis anterior tendon intact. Extensor hallucis longus tendon intact Extensor digitorum longus tendon intact.   Achilles: Moderate tendinosis of the Achilles tendon with a small interstitial tear just proximal to the calcaneal insertion. Mild retrocalcaneal bursitis.   Plantar Fascia: Intact.   LIGAMENTS   Lateral: Anterior talofibular ligament intact. Calcaneofibular ligament intact. Posterior talofibular ligament intact. Anterior and posterior tibiofibular ligaments intact.   Medial: Deltoid ligament intact. Spring  ligament intact.   CARTILAGE   Ankle Joint: No joint effusion. Normal ankle mortise. No chondral defect.   Subtalar Joints/Sinus Tarsi: Normal subtalar joints. No subtalar joint effusion. Normal sinus tarsi.   Bones: No marrow signal abnormality. No fracture or dislocation. No aggressive osseous lesion. 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   Soft Tissue: No fluid collection or hematoma. Muscles are normal without edema or atrophy. Tarsal tunnel is normal.   IMPRESSION: IMPRESSION 1. 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 No further evaluation recommended. 2. Moderate tendinosis of the Achilles tendon with a small interstitial tear just proximal to the calcaneal insertion. Mild retrocalcaneal bursitis.     Electronically Signed   By: Kathreen Devoid   On: 03/06/2021 09:38      1. Tendonitis, Achilles, right        Plan:  Patient was evaluated and treated and all questions answered.  Overall doing very well and she is made quite a bit of improvement.  I think she can transition back to her regular activities and shoe gear and work her way out of the heel lifts over the next couple of months.  Expect this to resolve with her continued home physical therapy exercises.  Return to see me as needed for this or other issues  Return if symptoms worsen or fail to improve.

## 2021-11-11 IMAGING — MR MR ANKLE*R* WO/W CM
9 series · 39 of 40 positions shown · IV contrast (multihance)
Comparison: None.

CLINICAL DATA: Posterior ankle pain. Bone lesion noted on x-ray
performed at outside facility.

EXAM:
MRI OF THE RIGHT ANKLE WITHOUT AND WITH CONTRAST
TECHNIQUE: Multiplanar, multisequence MR imaging of the ankle was performed
before and after the administration of intravenous contrast.
CONTRAST:  20mL MULTIHANCE GADOBENATE DIMEGLUMINE 529 MG/ML IV SOLN

[Series 4: T2 fat-sat · axial · 3.0mm · 0.50mm/px · z∈[-52,+69]mm · 4 of 32 slices shown (1 of 2)]
[im 1/32]
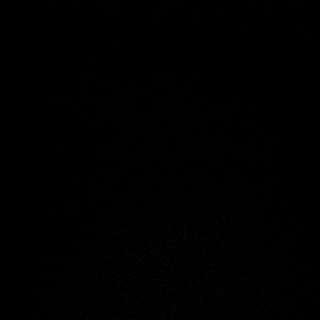
[im 11/32]
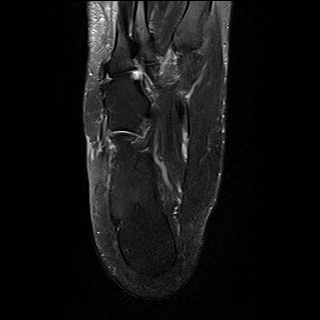
[im 21/32]
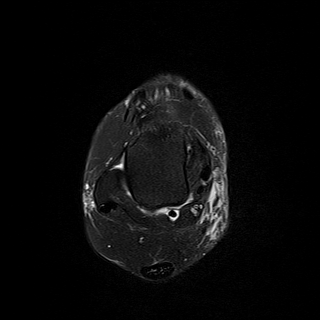
[im 32/32]
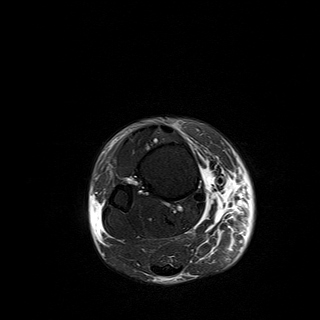

[Series 5: PD fat-sat · axial · 3.0mm · 0.42mm/px · z∈[-52,+69]mm · 5 of 32 slices shown]
[im 1/32]
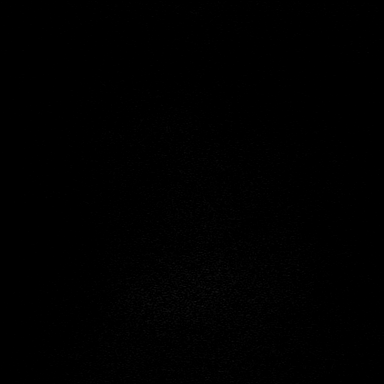
[im 8/32]
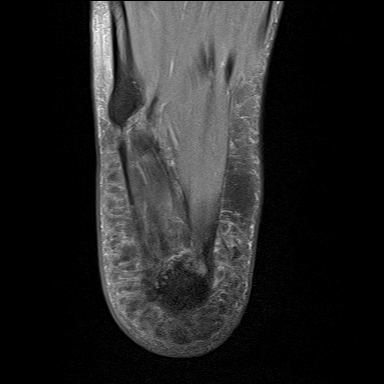
[im 16/32]
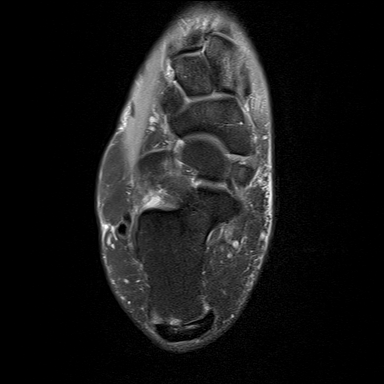
[im 24/32]
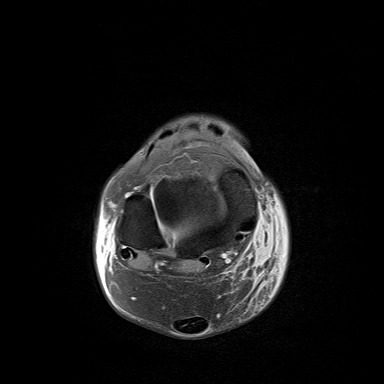
[im 32/32]
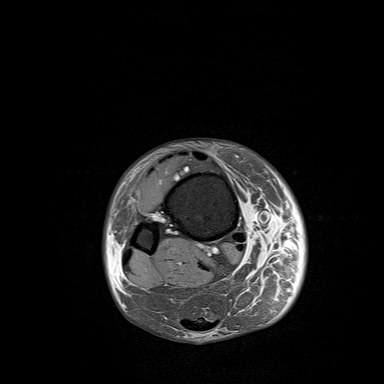

[Series 6: T1 · sagittal · 4.0mm · 0.56mm/px · 3 of 18 slices shown]
[im 1/18]
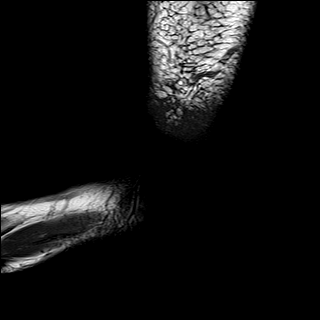
[im 9/18]
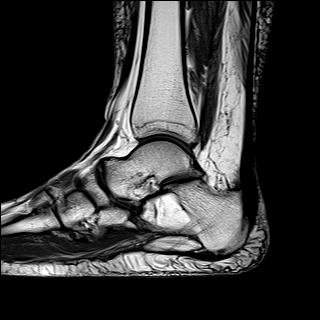
[im 18/18]
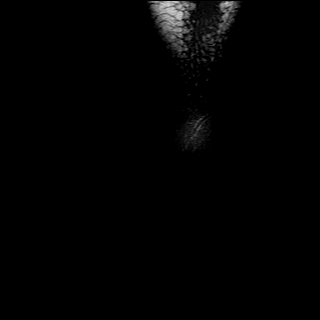

[Series 7: STIR · sagittal · 4.0mm · 0.35mm/px · 3 of 18 slices shown]
[im 1/18]
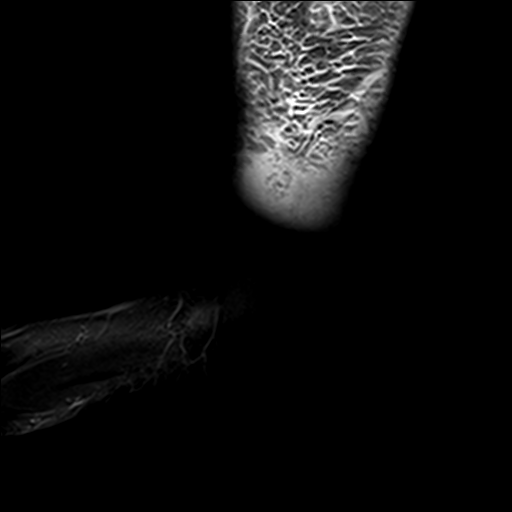
[im 9/18]
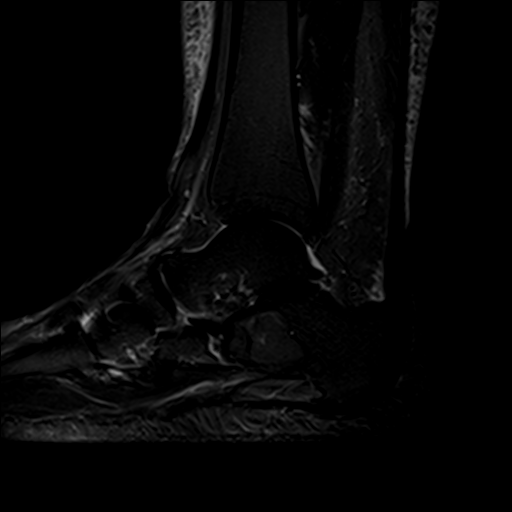
[im 18/18]
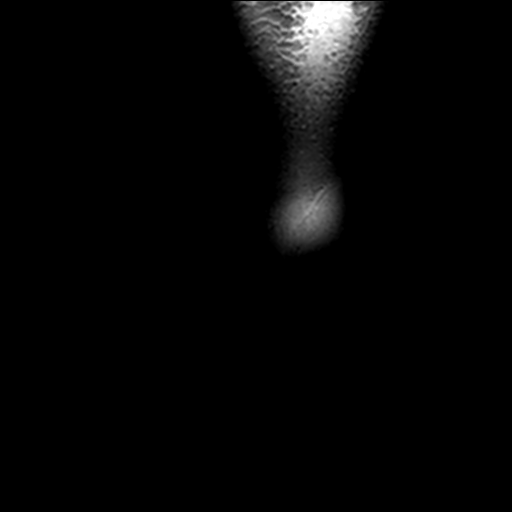

[Series 8: T2 fat-sat · coronal · 3.0mm · 0.50mm/px · 6 of 35 slices shown (2 of 2)]
[im 1/35]
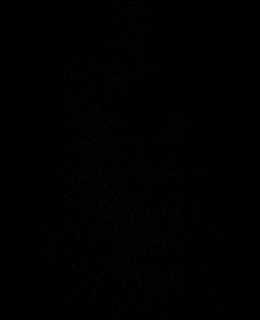
[im 7/35]
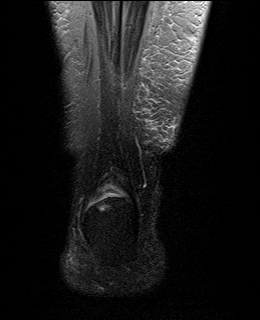
[im 14/35]
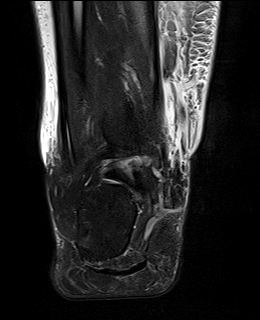
[im 21/35]
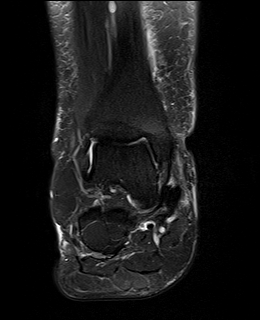
[im 28/35]
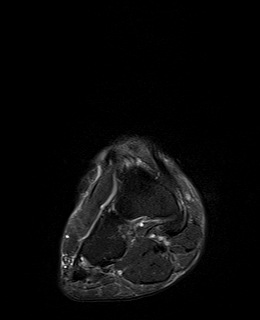
[im 35/35]
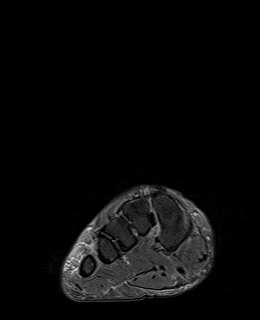

[Series 9: T1 fat-sat · axial · 3.0mm · 0.62mm/px · z∈[-51,+69]mm · 5 of 32 slices shown]
[im 1/32]
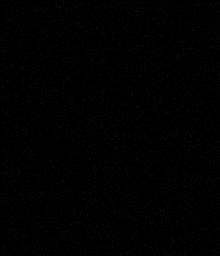
[im 8/32]
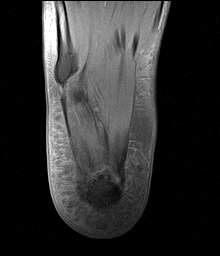
[im 16/32]
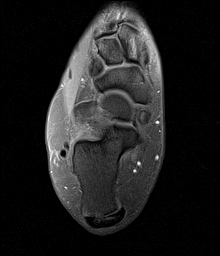
[im 24/32]
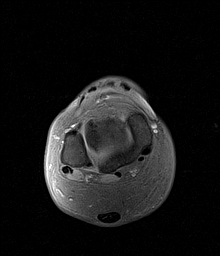
[im 32/32]
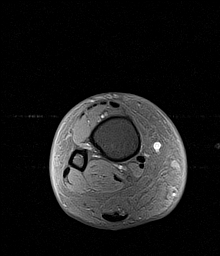

[Series 10: T1 post-contrast · axial · 3.0mm · 0.62mm/px · z∈[-51,+69]mm · 5 of 32 slices shown (1 of 2)]
[im 1/32]
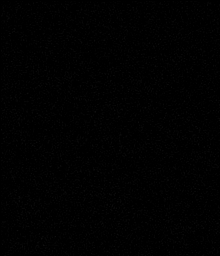
[im 8/32]
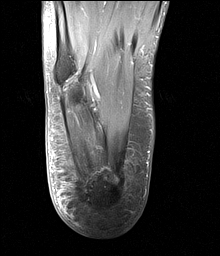
[im 16/32]
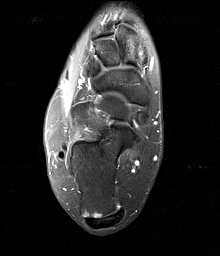
[im 24/32]
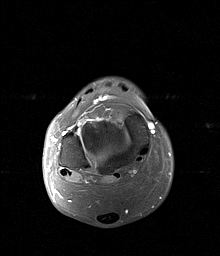
[im 32/32]
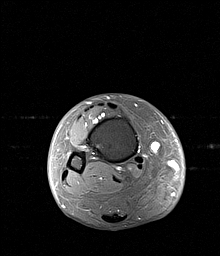

[Series 11: T1 post-contrast · sagittal · 4.0mm · 0.56mm/px · 3 of 18 slices shown (2 of 2)]
[im 1/18]
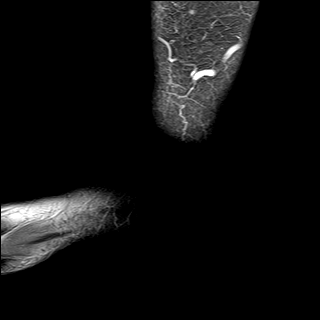
[im 9/18]
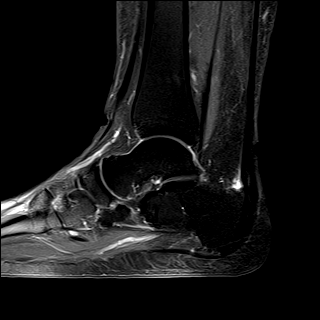
[im 18/18]
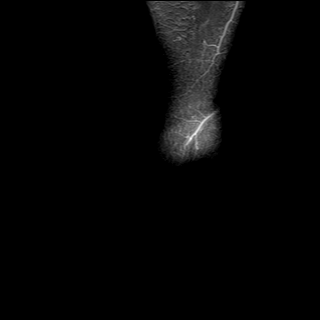

[Series 12: cor post · coronal · 3.0mm · 0.31mm/px · 5 of 35 slices shown]
[im 1/35]
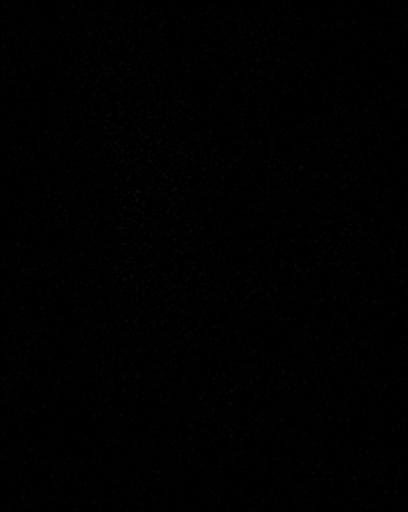
[im 7/35]
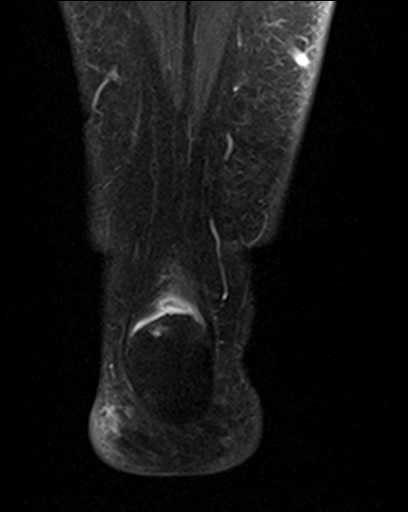
[im 14/35]
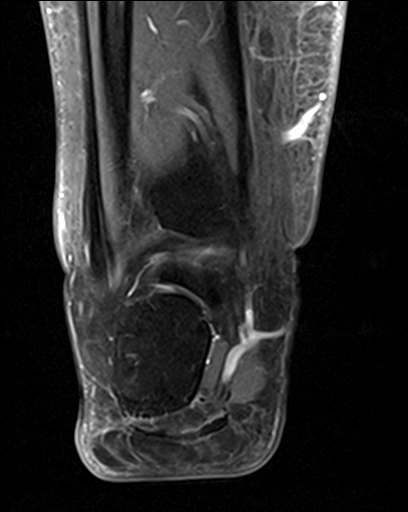
[im 21/35]
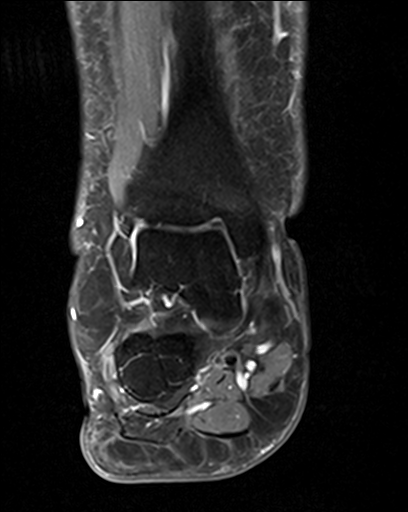
[im 28/35]
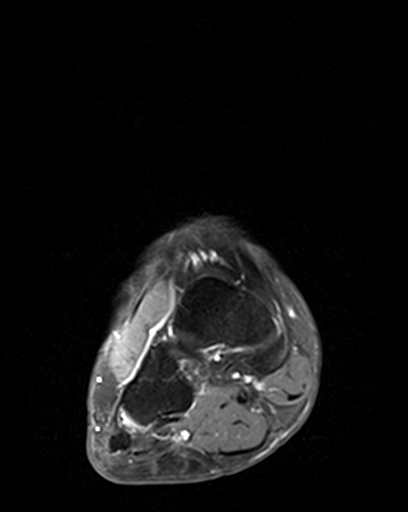

[39 of 40 positions shown; findings below may reference images not displayed]

FINDINGS: TENDONS

Peroneal: Peroneal longus tendon intact. Peroneal brevis intact.

Posteromedial: Posterior tibial tendon intact. Flexor hallucis
longus tendon intact. Flexor digitorum longus tendon intact.

Anterior: Tibialis anterior tendon intact. Extensor hallucis longus
tendon intact Extensor digitorum longus tendon intact.

Achilles: Moderate tendinosis of the Achilles tendon with a small
interstitial tear just proximal to the calcaneal insertion. Mild
retrocalcaneal bursitis.

Plantar Fascia: Intact.

LIGAMENTS

Lateral: Anterior talofibular ligament intact. Calcaneofibular
ligament intact. Posterior talofibular ligament intact. Anterior and
posterior tibiofibular ligaments intact.

Medial: Deltoid ligament intact. Spring ligament intact.

CARTILAGE

Ankle Joint: No joint effusion. Normal ankle mortise. No chondral
defect.

Subtalar Joints/Sinus Tarsi: Normal subtalar joints. No subtalar
joint effusion. Normal sinus tarsi.

Bones: No marrow signal abnormality. No fracture or dislocation. No
aggressive osseous lesion. 3.3 x 2 x 3.2 cm T2 hyperintense
intramedullary mass within the mid and anterior calcaneus with
complete signal dropout on fat saturated images, and without
significant enhancement on postcontrast imaging most consistent with
a simple intraosseous lipoma.

Soft Tissue: No fluid collection or hematoma. Muscles are normal
without edema or atrophy. Tarsal tunnel is normal.
IMPRESSION: IMPRESSION
1. Simple intraosseous lipoma in the mid-anterior calcaneus. No
further evaluation recommended.
2. Moderate tendinosis of the Achilles tendon with a small
interstitial tear just proximal to the calcaneal insertion. Mild
retrocalcaneal bursitis.

## 2021-11-13 ENCOUNTER — Other Ambulatory Visit: Payer: Self-pay

## 2021-11-13 ENCOUNTER — Ambulatory Visit (INDEPENDENT_AMBULATORY_CARE_PROVIDER_SITE_OTHER): Payer: BC Managed Care – PPO | Admitting: Family Medicine

## 2021-11-13 DIAGNOSIS — Z713 Dietary counseling and surveillance: Secondary | ICD-10-CM | POA: Diagnosis not present

## 2021-11-13 DIAGNOSIS — E282 Polycystic ovarian syndrome: Secondary | ICD-10-CM

## 2021-11-13 NOTE — Progress Notes (Signed)
Telehealth Encounter (Email link to [email or web address] ) PCP: Ranae Pila, PA at [facility] ([place])  Therapist: Jeremy Johann, [place], [facility], [place] I connected with Abrina Petz (MRN [id number]) on 11/13/2021 by MyChart video-enabled, HIPAA-compliant telemedicine application, verified that I was speaking with the correct person using two identifiers, and that the patient was in a private environment conducive to confidentiality.  The patient agreed to proceed.  Persons participating in visit were patient and provider (registered dietitian) Kennith Center, PhD, RD, LDN, CEDRD.  Provider was located at [place] during this telehealth encounter; patient was at work.  Appt start time: 1100 end time: 1200 (1 hour)  Reason for telehealth visit: Referred by therapist Jeremy Johann for Medical Nutrition Therapy related to weight management and PCOS.    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  She has a h/o PCOS and obesity.  Has "always" had elevated CRP.  She was seen by RD Lynden Ang in 2018 for PCOS and A1c of 5.8 at that time.  Has seen RD York Ram extensively, exploring nutritional aspects of PCOS and body acceptance.  Zakiya has been talking with Jeremy Johann about intuitive eating.  She started metformin in 2016, but discontinued it in March 2021 when she started a Berberine trial through the [facility].  Mirtie has continued taking Berberine, and her A1c has held pretty steady, while not causing GI problems she experienced with metformin.   Assessment: [facility] Rx for Mancel Parsons was approved by her insurance company (after 6 weeks).  It is back-ordered, so she has not started it yet.  She has not completed the Meal Planning form provided previously, but has done pretty well with respect to planning balanced meals.  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  Still struggling some with respect to balance between work and non-work, but hopes to read the Four Tendencies book over winter break.  Lawson  completed 6 weeks of PT for her R foot tendinitis last week, and she is now able to bear weight on that foot.  Still has soreness, but has walked as much as a mile.   Usual eating pattern: 3 meals and 0-1 snack per day. Usual physical activity: Walking nearly daily; has increased walking to as much as a mile, and is able to use standing desk more at work.  Also doing 20-30(+) min PT exercises daily.  Pediatrist estimated it will be late Feb before swelling and pain are completely gone.   Sleep: Estimates she is getting 6-8 hrs/night; sleeping better since no longer using sleeping boot.   24-hr recall:  (Up at 8:30 AM) B (9 AM)-  1/2 homemade pumpkin muffin, few bites raspberry [place], hot tea, diet flavored creamer, 1 Stevia  Snk ( AM)-  --- L (1 PM)-  [place] food: 1 1/2 c egg noodles w/ chicken, water Snk (3 PM)-  Starbucks chai tea D (6:30 PM)-  1 c Honey Chex, 1 1/2 c fat-free milk, 1 c hot tea, diet flavored creamer, 1 Stevia  Snk (10:30)-  6 oz Fairlife protein shake (15 pro, 75 kcal) Typical day? No; was traveling.  Focused on getting protein at each meal, carbs earlier in the day; salad with protein and no added carb for lunch most days.  Drinking protein shake daily.    Intervention: Completed diet and exercise history, and confirmed behavioral goals, and encouraged attn to sleep quantity and  quality.  For recommendations and goals, see Patient Instructions.    Follow-up: Telehealth appt in 11 weeks.   Kayelynn Abdou,[name]

## 2021-11-13 NOTE — Patient Instructions (Signed)
Goals remain the same:  1.  Limit carb portions to two per meal and one per snack, especially at dinnertime or later.  Reminder: ONE portion of a starchy food is equal to the following:             - ONE slice of bread (or its equivalent, e.g., half of a hamburger bun).             - 1/2 cup of a "scoopable" starchy food such as potatoes or rice.             - 15 grams of Total Carbohydrate as shown on food label.             - 4 ounces of a sweet drink (including fruit juice). 2. Obtain at least 20 grams of protein per meal.   - 1 ounce of meat, fish, poultry, or cheese or 1 egg provides about 7 grams of protein.  - 8 oz of milk or 2 tbsp peanut butter provides 8 grams of protein. - 1 cup (8 oz) of most beans provides 15 grams of protein.    - Yogurts vary greatly, but [place] yogurt is highest in protein.     Review The Four Tendencies, and write down:  Specific actions you can take that will help you follow through with your intended behaviors.   Examples of incidents you notice that were influenced by your Obliger tendency.    Pay attn to both quality and quantity of sleep, and do what you can to optimize both.   (And listen to recommended podcast Sunday Read from The Daily re. Circadian medicine: https://podcasts.apple.com/us/podcast/the-sunday-read-the-quest-by-circadian-medicine-to/id1200361736?P=2162446950722)   Follow-up: Video visit on Tuesday, Feb 7 at 10:30 AM.

## 2021-11-22 ENCOUNTER — Encounter: Payer: BC Managed Care – PPO | Admitting: Podiatry

## 2022-01-29 NOTE — Progress Notes (Signed)
Telehealth Encounter (Email link to [email or web address] ) PCP: [employer], PA at [facility]  Therapist: [address], [place], [facility], [place] I connected with Cynthia Mills (MRN Cynthia Mills) on 01/30/2022 by MyChart video-enabled, HIPAA-compliant telemedicine application, verified that I was speaking with the correct person using two identifiers, and that the patient was in a private environment conducive to confidentiality.  The patient agreed to proceed.  Persons participating in visit were patient and provider (registered dietitian) 15/06/2022, PhD, RD, LDN, CEDRD.  Provider was located at [place] during this telehealth encounter; patient was at home.  Appt start time: 1030 end time: 1130 (1 hour)  Reason for telehealth visit: Referred by therapist [address] for Medical Nutrition Therapy related to weight management and PCOS.    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  Has "always" had elevated CRP.  She was seen by RD [employer] in 2018 for PCOS and A1c of 5.8 at that time.  Has seen RD 2019 extensively, exploring nutritional aspects of PCOS and body acceptance.  Cynthia Mills has been talking with Cynthia Mills about intuitive eating.  She started metformin in 2016, but discontinued it in March 2021 when she started a Berberine trial through the [facility].  Cynthia Mills has continued taking Berberine, and her A1c has held pretty steady, while not causing GI problems she experienced with metformin.   Assessment: Cynthia Mills has not yet started taking Wegovy b/c of it being on backorder.  She will call the pharmacy today to follow up on this.  Still has some swelling and pain in her R Achilles tendon, but she has been able to walk a mile 1-2 X day.  Lular read The Four Tendencies book, but she has not yet written about specific actions she can take to help her  follow through with her intended behaviors in light of this tendency.  She plans to discuss this with therapist Cynthia Mills.    Usual eating pattern: 3 meals and 0-2 snacks per day.  Marce has been making a weekly menu most Sundays, which has been helpful.  Breakfast remains consistent.  Lunch is most challenging.  Dinner not at consistent time b/c of work obligations.   Usual physical activity: Walking 1 mile 1-2 X daily.  Also doing 10(+) min PT exercises 3-4 X wk.   Sleep: Estimates she is getting 6-8 hrs/night.  Started using a sleep mask a couple weeks ago, which is somewhat challenging along with her CPAP.   24-hr recall:  (Up at 6:30 AM) B (8 AM)-  2 eggs, 2 slc bacon, 1 biscuit, water  - Walked 1 mile -  Snk (9 AM)-  1 c hot tea L (12:30 PM)-  1 bag carrots, 2 oz cheese, 10 rice crkrs, water Snk (3 PM)-  1 oz nuts, diet green tea D (8 PM)-  3 oz steak, 3 shrimp, 1/2 c carrots, ~10 stalks asparagus, 6 oz f-f milk Snk (9:30)-  1 c decaf tea, diet creamer, stevia Snk (10:30)-  6 oz Fairlife protein shake Typical day? Yes.   Although often eating salad for lunch.    Intervention: Completed diet and exercise history, confirmed behavioral goals, and encouraged establishing a brief strength exercise she will do at least 5 days a week.  For recommendations and goals, see Patient Instructions.    Follow-up: Telehealth appt in 6 weeks.   Cynthia Mills,[name]

## 2022-01-30 ENCOUNTER — Other Ambulatory Visit: Payer: Self-pay

## 2022-01-30 ENCOUNTER — Ambulatory Visit (INDEPENDENT_AMBULATORY_CARE_PROVIDER_SITE_OTHER): Payer: BC Managed Care – PPO | Admitting: Family Medicine

## 2022-01-30 DIAGNOSIS — E282 Polycystic ovarian syndrome: Secondary | ICD-10-CM

## 2022-01-30 NOTE — Patient Instructions (Signed)
Goals:  1.  Limit carb portions to two per meal and one per snack, especially at dinnertime or later.  Reminder: ONE portion of a starchy food is equal to the following:             - ONE slice of bread (or its equivalent, e.g., half of a hamburger bun).             - 1/2 cup of a "scoopable" starchy food such as potatoes or rice.             - 15 grams of Total Carbohydrate as shown on food label.             - 4 ounces of a sweet drink (including fruit juice). 2. Obtain at least 20 grams of protein per meal.   - 1 ounce of meat, fish, poultry, or cheese or 1 egg provides about 7 grams of protein.  - 8 oz of milk or 2 tbsp peanut butter provides 8 grams of protein. - 1 cup (8 oz) of most beans provides 15 grams of protein.    - Yogurts vary greatly, but [place] yogurt is highest in protein.    In addition: - Determine a "small task" strength activity that you can do at least 5 days a week.  Email [name] your decision no later than 02-06-22.    - Document daily on your goals to:   - get 20 grams of protein per meal  - complete your strength activity  Continue exploring exercise options such as yoga.    Talk with Florentina Jenny re. Obliger tendency, and plan to make a list of specific aspects of the tendency that represent particular challenges for you.    Reiterated from last appt:   Review The Four Tendencies, and write down:  Specific actions you can take that will help you follow through with your intended behaviors.   Examples of incidents you notice that were influenced by your Obliger tendency.     Follow-up: Video visit on Tuesday, Mar 21 at 10 AM.

## 2022-03-13 ENCOUNTER — Telehealth: Payer: BC Managed Care – PPO | Admitting: Family Medicine

## 2022-04-02 NOTE — Progress Notes (Signed)
Telehealth Encounter (Email link to [email or web address] ) ?PCP: Ranae Pila, PA at [facility])  ?Therapist: Jeremy Johann, MA, [facility], [place] ?I connected with Stepheni Cameron (MRN [id number]) on 04/03/2022 by MyChart video-enabled, HIPAA-compliant telemedicine application, verified that I was speaking with the correct person using two identifiers, and that the patient was in a private environment conducive to confidentiality.  The patient agreed to proceed. ? ?Persons participating in visit were patient and provider (registered dietitian) Kennith Center, PhD, RD, LDN, CEDRD.  Provider was located at [place] during this telehealth encounter; patient was at home. ? ?Appt start time: 0930 end time: 1030 (1 hour) ? ?Reason for telehealth visit: Referred by therapist Jeremy Johann for Medical Nutrition Therapy related to weight management and PCOS.   ? 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  She has a h/o PCOS and obesity.  Has "always" had elevated CRP.  She was seen by RD Lynden Ang in 2018 for PCOS and A1c of 5.8 at that time.  Has seen RD York Ram extensively, exploring nutritional aspects of PCOS and body acceptance.  Teola has been talking with Jeremy Johann about intuitive eating.  She started metformin in 2016, but discontinued it in March 2021 when she started a Berberine trial through the [facility].  Tammara has continued taking Berberine, and her A1c has held pretty steady, while not causing GI problems she experienced with metformin.  ? ?Assessment: Danyka started semaglutide [facility]) in mid-February, which requires a $90 co-pay and authorization each time it is renewed; just went up to 0.75-mg dose.  (Continued use of Wegovey at 1 mg dose requires a 4% weight loss by her insur company.)  She has some nausea for a couple days after her weekly injection, but has lost 18 lb in 2 months.  She has  reduced berberine from 2 to 1 pill bid.  Joined Weight Watchers 2 weeks ago; aiming for a daily point total of 45.  She is very focused currently on mindful choices, portion control, and her relationship with food, trying to let go of the attitude of "good foods, bad foods" or that certain foods are off-limits.       ?Tonianne discussed the book The Four Tendencies book with therapist Jeremy Johann only briefly, but she has not yet written about specific actions she can take to help her follow through with her intended behaviors in light of this tendency.   ?Sheron has not specifically tracked her goals of limiting carbs and getting protein at meals, but has been mindful of both, especially carb intake.   ?Usual eating pattern: 3 meals and 0-2 snacks per day.  Has continued making a weekly menu most Sundays.   ?Usual physical activity: Walking ~2 miles 5-7 X wk, and averaging ~8000 steps/day.  Has had some heel pain as activity has increased.  Also doing 10(+) min PT stretches 3-4 X wk.  Also doing wall sits or squats while brushing her teeth once a day.  Has tried online chair yoga occasionally.   ?Sleep: Estimates 7-8 hrs/night.  Still using a sleep mask in addn to CPAP. ?24-hr recall:  ?(Up at 8:30 AM) ?B (9:30 AM)-  1 clementine, 1 c Multigrain Cheerios, 1 c Fairlife f-f milk, water ?Snk (10 AM)-  1 c hot tea, Stevia, 1-2 tbsp diet creamer, water ?L (1 PM)-  2 oz ham, 2 thin-slice bread, 1 tsp mayo, diet Green tea ?Snk (3 PM)-  1 c hot tea, Stevia, diet creamer ?D (8 PM)-  3 oz beef tips, 1/2 c mashed potatoes, 1 c zucchini, 1/2 c succotash, 10 oz f-f milk ?Snk ( PM)-  --- ?Typical day? Yes.   Except started late b/c was off from work.  Usually bringing pre-made salad with protein plus snack pack (cheese & nuts or fruit & nuts) or fruit to work for lunch.   ? ?Intervention: Completed diet and exercise history, and confirmed behavioral goals. ? ?For recommendations and goals, see Patient Instructions.    ? ?Follow-up: Telehealth appt in 8 weeks.  ? ?Hester Joslin,[name] ?  ?

## 2022-04-03 ENCOUNTER — Ambulatory Visit (INDEPENDENT_AMBULATORY_CARE_PROVIDER_SITE_OTHER): Payer: BC Managed Care – PPO | Admitting: Family Medicine

## 2022-04-03 DIAGNOSIS — E282 Polycystic ovarian syndrome: Secondary | ICD-10-CM

## 2022-04-03 NOTE — Patient Instructions (Signed)
-   Weight Watchers points:  Keep in mind that even if it's a zero-point food in [employer], it may still have carbohydrate; every 15 grams of carbohydrate = 1 carb portion.   ?- Zero-calorie (processed) foods: It's hard to do better (nutritionally as well as for the health of the planet) than whole, real foods.   ? ?Goals remain the same:  ?1.  Limit carb portions to two per meal and one per snack, especially at dinnertime or later.  Reminder: ONE portion of a starchy food is equal to the following: ?            - ONE slice of bread (or its equivalent, e.g., half of a hamburger bun). ?            - 1/2 cup of a "scoopable" starchy food such as potatoes or rice. ?            - 15 grams of Total Carbohydrate as shown on food label. ?            - 4 ounces of a sweet drink (including fruit juice). ?2. Obtain at least 20 grams of protein per meal.   ?    Generally, aim for at least 3 ounces of meat or fish or at least 1 full cup of beans.  (1 cup of most beans = 2 oz of meat.) ?- 1 ounce of meat, fish, poultry, or cheese or 1 egg provides about 7 grams of protein.  ?- 8 oz of milk or 2 tbsp peanut butter provides 8 grams of protein. ?- 1 cup (8 oz) of most beans provides 15 grams of protein.    ?- Yogurts vary greatly, but [place] yogurt is highest in protein.   ? 3. Obtain at least 48 oz of water per day.  (Unsweetened tea counts.) ? ?Continue doing your squats or wall sit daily.   ?  ?Reiterated from last appt:   ?Review The Four Tendencies, and write down:  ?Specific actions you can take that will help you follow through with your intended behaviors.   ?Examples of incidents you notice that were influenced by your Obliger tendency.   ?  ?Follow-up: Video visit on June 13 at 10 AM.  ?  ?

## 2022-06-05 ENCOUNTER — Ambulatory Visit (INDEPENDENT_AMBULATORY_CARE_PROVIDER_SITE_OTHER): Payer: BC Managed Care – PPO | Admitting: Family Medicine

## 2022-06-05 DIAGNOSIS — E282 Polycystic ovarian syndrome: Secondary | ICD-10-CM | POA: Diagnosis not present

## 2022-06-05 NOTE — Progress Notes (Signed)
Telehealth Encounter (Email link to [email or web address] ) PCP: [employer], PA at [facility]  Therapist: [address], [place], [facility], [place] I connected with Cynthia Mills (MRN Cynthia Mills) on 06/05/2022 by MyChart video-enabled, HIPAA-compliant telemedicine application, verified that I was speaking with the correct person using two identifiers, and that the patient was in a private environment conducive to confidentiality.  The patient agreed to proceed.  Persons participating in visit were patient and provider (registered dietitian) 06/18/2022, PhD, RD, LDN, CEDRD.  Provider was located at [place] during this telehealth encounter; patient was at home.  Appt start time: 0930 end time: 1030 (1 hour)  Reason for telehealth visit: Referred by therapist [address] for Medical Nutrition Therapy related to weight management and PCOS.    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  Has "always" had elevated CRP.  She was seen by RD Cynthia Mills, [employer] in 2018 for PCOS and A1c of 5.8 at that time.  Has seen RD 2019 extensively, exploring nutritional aspects of PCOS and body acceptance.  Cynthia Mills has been talking with Cynthia Mills about intuitive eating.  She started metformin in 2016, but discontinued it in March 2021 when she started a Berberine trial through the [facility].  Cynthia Mills has continued taking Berberine, and her A1c has held pretty steady, while not causing GI problems she experienced with metformin.   Assessment: Cynthia Mills just returned from a >2-week [place] vacation with her family, which included a lot of walking.  Her heel held up well throughout.  She has lost 33 lb since starting semaglutide [facility]) in mid-February (231 lb this morning).  Cynthia Mills said she feels a change since semaglutide is that she feels more in control with food choices, that there has  been less chatter in her head re. food choices.   On 5/16 A1C was 5.8 (lowest it's been since 2019.)  Lipids were also lower: TC was 174, HDL 47.5, LDL was 111, and TG were 77.  CRP was 21 (25 in 2020), sed rate was 40 (normal = up to 30), and there were some marginally abnormal CBC results, so Cynthia Mills will be seeing a rheumatologist on July 31.   Cynthia Mills is still tracking Weight Watchers points, with a daily target of 45.     Usual eating pattern: 3 meals and 0-2 snacks per day.  Has continued making a weekly menu most Sundays.   Usual physical activity: Has not yet established a routine since getting back from vacation, but has been walking the dog, and plans to get back to regular walking.  She plans to explore a Women's-only gym near her home for strength-training options.   Sleep: Estimates 5-6 hrs/night on vacation.  Her travel CPAP was not as effective as her usual one at home.    No food recall today.  Intervention: Completed diet and exercise history, confirmed behavioral goals, and discussed emphases needed for ongoing health promotion and maintenance.  For recommendations and goals, see Patient Instructions.    Follow-up: Telehealth appt in 3 months.   Curry Dulski,[name]

## 2022-06-05 NOTE — Patient Instructions (Addendum)
Emphasize going forward:  - Choose mostly whole, real foods.   - Nutritional balance such that meals all contain (>20 grams!) protein, moderate starch, and lots of vegetables (with fruits for snacks or included in meals).   - Maintain a consistent physical activity routine, which includes both aerobic and resistance type of exercise.   - Adequate sleep.   - Allow for self-care; say no as appropriate.  (And give yourself permission to do just fun stuff!) Reiterated from last appt:   Review The Four Tendencies, and write down:  Specific actions you can take that will help you follow through with your intended behaviors.   Examples of incidents you notice that were influenced by your Obliger tendency.     Goals remain the same:  1.  Limit carb portions to two per meal and one per snack, especially at dinnertime or later.  Reminder: ONE portion of a starchy food is equal to the following:             - ONE slice of bread (or its equivalent, e.g., half of a hamburger bun).             - 1/2 cup of a "scoopable" starchy food such as potatoes or rice.             - 15 grams of Total Carbohydrate as shown on food label.             - 4 ounces of a sweet drink (including fruit juice). 2. Obtain at least 20 grams of protein per meal.       Generally, aim for at least 3 ounces of meat or fish or at least 1 full cup of beans.  (1 cup of most beans = 2 oz of meat.) - 1 ounce of meat, fish, poultry, or cheese or 1 egg provides about 7 grams of protein.  - 8 oz of milk or 2 tbsp peanut butter provides 8 grams of protein. - 1 cup (8 oz) of most beans provides 15 grams of protein.    - Yogurts vary greatly, but [place] yogurt is highest in protein.    3. Obtain at least 48 oz of water per day.  (Unsweetened tea counts.)   Follow-up: Video visit on Tuesday, September 19 at 1:30 PM.

## 2022-06-07 ENCOUNTER — Encounter: Payer: Self-pay | Admitting: Gastroenterology

## 2022-06-08 ENCOUNTER — Ambulatory Visit: Payer: BC Managed Care – PPO | Admitting: Registered Nurse

## 2022-06-08 ENCOUNTER — Encounter
Admission: RE | Disposition: A | Discharge status: Home / Self Care | Payer: Self-pay | Source: Home / Self Care | Attending: Gastroenterology

## 2022-06-08 ENCOUNTER — Ambulatory Visit
Admission: RE | Admit: 2022-06-08 | Discharge: 2022-06-08 | Disposition: A | Discharge status: Home / Self Care | Payer: BC Managed Care – PPO | Attending: Gastroenterology | Admitting: Gastroenterology

## 2022-06-08 ENCOUNTER — Encounter: Payer: Self-pay | Admitting: Gastroenterology

## 2022-06-08 DIAGNOSIS — K6289 Other specified diseases of anus and rectum: Secondary | ICD-10-CM | POA: Diagnosis not present

## 2022-06-08 DIAGNOSIS — K648 Other hemorrhoids: Secondary | ICD-10-CM | POA: Insufficient documentation

## 2022-06-08 DIAGNOSIS — Z1289 Encounter for screening for malignant neoplasm of other sites: Secondary | ICD-10-CM | POA: Diagnosis present

## 2022-06-08 DIAGNOSIS — E559 Vitamin D deficiency, unspecified: Secondary | ICD-10-CM | POA: Insufficient documentation

## 2022-06-08 DIAGNOSIS — D123 Benign neoplasm of transverse colon: Secondary | ICD-10-CM | POA: Diagnosis not present

## 2022-06-08 DIAGNOSIS — K644 Residual hemorrhoidal skin tags: Secondary | ICD-10-CM | POA: Diagnosis not present

## 2022-06-08 DIAGNOSIS — Z86010 Personal history of colonic polyps: Secondary | ICD-10-CM | POA: Diagnosis not present

## 2022-06-08 DIAGNOSIS — K219 Gastro-esophageal reflux disease without esophagitis: Secondary | ICD-10-CM | POA: Insufficient documentation

## 2022-06-08 DIAGNOSIS — J309 Allergic rhinitis, unspecified: Secondary | ICD-10-CM | POA: Diagnosis not present

## 2022-06-08 DIAGNOSIS — Z1211 Encounter for screening for malignant neoplasm of colon: Secondary | ICD-10-CM | POA: Insufficient documentation

## 2022-06-08 DIAGNOSIS — K5730 Diverticulosis of large intestine without perforation or abscess without bleeding: Secondary | ICD-10-CM | POA: Diagnosis not present

## 2022-06-08 DIAGNOSIS — K2270 Barrett's esophagus without dysplasia: Secondary | ICD-10-CM | POA: Diagnosis not present

## 2022-06-08 DIAGNOSIS — G4730 Sleep apnea, unspecified: Secondary | ICD-10-CM | POA: Diagnosis not present

## 2022-06-08 DIAGNOSIS — K449 Diaphragmatic hernia without obstruction or gangrene: Secondary | ICD-10-CM | POA: Diagnosis not present

## 2022-06-08 HISTORY — PX: ESOPHAGOGASTRODUODENOSCOPY: SHX5428

## 2022-06-08 HISTORY — DX: Gastro-esophageal reflux disease without esophagitis: K21.9

## 2022-06-08 HISTORY — DX: Sleep apnea, unspecified: G47.30

## 2022-06-08 HISTORY — PX: COLONOSCOPY: SHX5424

## 2022-06-08 HISTORY — DX: Barrett's esophagus without dysplasia: K22.70

## 2022-06-08 SURGERY — COLONOSCOPY
Anesthesia: General

## 2022-06-08 MED ORDER — PROPOFOL 10 MG/ML IV BOLUS
INTRAVENOUS | Status: DC | PRN
  Administered 2022-06-08 (×5): 20 mg via INTRAVENOUS
  Administered 2022-06-08: 100 mg via INTRAVENOUS
  Administered 2022-06-08: 20 mg via INTRAVENOUS

## 2022-06-08 MED ORDER — PROPOFOL 500 MG/50ML IV EMUL
INTRAVENOUS | Status: DC | PRN
  Administered 2022-06-08: 150 ug/kg/min via INTRAVENOUS

## 2022-06-08 MED ORDER — DEXMEDETOMIDINE (PRECEDEX) IN NS 20 MCG/5ML (4 MCG/ML) IV SYRINGE
PREFILLED_SYRINGE | INTRAVENOUS | Status: DC | PRN
  Administered 2022-06-08: 12 ug via INTRAVENOUS
  Administered 2022-06-08: 8 ug via INTRAVENOUS

## 2022-06-08 MED ORDER — LIDOCAINE HCL (CARDIAC) PF 100 MG/5ML IV SOSY
PREFILLED_SYRINGE | INTRAVENOUS | Status: DC | PRN
  Administered 2022-06-08: 100 mg via INTRAVENOUS

## 2022-06-08 MED ORDER — PHENYLEPHRINE 80 MCG/ML (10ML) SYRINGE FOR IV PUSH (FOR BLOOD PRESSURE SUPPORT)
PREFILLED_SYRINGE | INTRAVENOUS | Status: DC | PRN
  Administered 2022-06-08: 160 ug via INTRAVENOUS

## 2022-06-08 MED ORDER — PROPOFOL 500 MG/50ML IV EMUL
INTRAVENOUS | Status: AC
  Filled 2022-06-08: qty 50

## 2022-06-08 MED ORDER — SODIUM CHLORIDE 0.9 % IV SOLN: INTRAVENOUS | Status: DC

## 2022-06-08 NOTE — Op Note (Signed)
[facility] Gastroenterology Patient Name: Cynthia Mills Procedure Date: 06/08/2022 7:15 AM MRN: [id number] Account #: [id number] Date of Birth: 01-10-1966 Admit Type: Outpatient Age: 56 Room: [facility] ENDO ROOM 1 Gender: Female Note Status: Finalized Instrument Name: Colonoscope [id number] Procedure:             Colonoscopy Indications:           High risk colon cancer surveillance: Personal history                         of colonic polyps Providers:             [name] DO, DO Referring MD:          Precious Bard, MD (Referring MD) Medicines:             Monitored Anesthesia Care Complications:         No immediate complications. Estimated blood loss:                         Minimal. Procedure:             Pre-Anesthesia Assessment:                        - Prior to the procedure, a History and Physical was                         performed, and patient medications and allergies were                         reviewed. The patient is competent. The risks and                         benefits of the procedure and the sedation options and                         risks were discussed with the patient. All questions                         were answered and informed consent was obtained.                         Patient identification and proposed procedure were                         verified by the physician, the nurse, the anesthetist                         and the technician in the endoscopy suite. Mental                         Status Examination: alert and oriented. Airway                         Examination: normal oropharyngeal airway and neck                         mobility. Respiratory Examination: clear to  auscultation. CV Examination: RRR, no murmurs, no S3                         or S4. Prophylactic Antibiotics: The patient does not                         require prophylactic antibiotics. Prior                          Anticoagulants: The patient has taken no previous                         anticoagulant or antiplatelet agents. ASA Grade                         Assessment: III - A patient with severe systemic                         disease. After reviewing the risks and benefits, the                         patient was deemed in satisfactory condition to                         undergo the procedure. The anesthesia plan was to use                         monitored anesthesia care (MAC). Immediately prior to                         administration of medications, the patient was                         re-assessed for adequacy to receive sedatives. The                         heart rate, respiratory rate, oxygen saturations,                         blood pressure, adequacy of pulmonary ventilation, and                         response to care were monitored throughout the                         procedure. The physical status of the patient was                         re-assessed after the procedure.                        After obtaining informed consent, the colonoscope was                         passed under direct vision. Throughout the procedure,                         the patient's blood pressure, pulse, and oxygen  saturations were monitored continuously. The                         Colonoscope was introduced through the anus and                         advanced to the the cecum, identified by appendiceal                         orifice and ileocecal valve. The colonoscopy was                         performed without difficulty. The patient tolerated                         the procedure well. The quality of the bowel                         preparation was evaluated using the BBPS [facility] Bowel                         Preparation Scale) with scores of: Right Colon = 3,                         Transverse Colon = 3 and Left Colon = 3 (entire mucosa                          seen well with no residual staining, small fragments                         of stool or opaque liquid). The total BBPS score                         equals 9. The ileocecal valve, appendiceal orifice,                         and rectum were photographed. Findings:      Hemorrhoids were found on perianal exam.      The digital rectal exam was normal. Pertinent negatives include normal       sphincter tone.      Multiple small-mouthed diverticula were found in the recto-sigmoid colon       and sigmoid colon. Estimated blood loss: none.      Non-bleeding external and internal hemorrhoids were found during       retroflexion and during perianal exam. Estimated blood loss: none.      Anal papilla(e) were hypertrophied. Estimated blood loss: none.      A 1 to 2 mm polyp was found in the transverse colon. The polyp was       sessile. The polyp was removed with a jumbo cold forceps. Resection and       retrieval were complete. Estimated blood loss was minimal.      The exam was otherwise without abnormality on direct and retroflexion       views. Impression:            - Hemorrhoids found on perianal exam.                        -  Diverticulosis in the recto-sigmoid colon and in the                         sigmoid colon.                        - Non-bleeding external and internal hemorrhoids.                        - Anal papilla(e) were hypertrophied.                        - One 1 to 2 mm polyp in the transverse colon, removed                         with a jumbo cold forceps. Resected and retrieved.                        - The examination was otherwise normal on direct and                         retroflexion views. Recommendation:        - Resume previous diet.                        - Continue present medications.                        - Await pathology results.                        - Repeat colonoscopy for surveillance based on                         pathology results.                         - Return to GI office as previously scheduled.                        - The findings and recommendations were discussed with                         the patient. Procedure Code(s):     --- Professional ---                        [id number], Colonoscopy, flexible; with biopsy, single or                         multiple Diagnosis Code(s):     --- Professional ---                        Z86.010, Personal history of colonic polyps                        K64.8, Other hemorrhoids                        K62.89, Other specified diseases of anus and rectum  K63.5, Polyp of colon                        K57.30, Diverticulosis of large intestine without                         perforation or abscess without bleeding CPT copyright 2019 American Medical Association. All rights reserved. The codes documented in this report are preliminary and upon coder review may  be revised to meet current compliance requirements. Attending Participation:      I personally performed the entire procedure. Volney American, DO [name] DO, DO 06/08/2022 8:19:28 AM This report has been signed electronically. Number of Addenda: 0 Note Initiated On: 06/08/2022 7:15 AM Scope Withdrawal Time: 0 hours 13 minutes 16 seconds  Total Procedure Duration: 0 hours 17 minutes 15 seconds  Estimated Blood Loss:  Estimated blood loss was minimal.      [facility]

## 2022-06-08 NOTE — Anesthesia Preprocedure Evaluation (Signed)
Anesthesia Evaluation  Patient identified by MRN, date of birth, ID band Patient awake    Reviewed: Allergy & Precautions, NPO status , Patient's Chart, lab work & pertinent test results  History of Anesthesia Complications Negative for: history of anesthetic complications  Airway Mallampati: III  TM Distance: <3 FB Neck ROM: full    Dental  (+) Chipped   Pulmonary sleep apnea and Continuous Positive Airway Pressure Ventilation ,    Pulmonary exam normal        Cardiovascular Exercise Tolerance: Good (-) angina(-) Past MI and (-) DOE negative cardio ROS Normal cardiovascular exam     Neuro/Psych negative neurological ROS  negative psych ROS   GI/Hepatic Neg liver ROS, GERD  Controlled,  Endo/Other  negative endocrine ROS  Renal/GU negative Renal ROS  negative genitourinary   Musculoskeletal   Abdominal   Peds  Hematology negative hematology ROS (+)   Anesthesia Other Findings Past Medical History: No date: Allergic rhinitis 06/02/2018: Atypical mole     Comment:  right spinal mid upper back  No date: Barrett esophagus No date: GERD (gastroesophageal reflux disease) No date: Hyperlipemia No date: PCOS (polycystic ovarian syndrome) No date: Plantar fasciitis No date: Sleep apnea No date: Vitamin D deficiency  Past Surgical History: 01/09/2016: COLONOSCOPY WITH PROPOFOL; N/A     Comment:  Procedure: COLONOSCOPY WITH PROPOFOL;  Surgeon: Manya Silvas, MD;  Location: [place];  Service:               Endoscopy;  Laterality: N/A; 01/09/2016: ESOPHAGOGASTRODUODENOSCOPY (EGD) WITH PROPOFOL; N/A     Comment:  Procedure: ESOPHAGOGASTRODUODENOSCOPY (EGD) WITH               PROPOFOL;  Surgeon: Manya Silvas, MD;  Location: [facility]              ENDOSCOPY;  Service: Endoscopy;  Laterality: N/A;  BMI    Body Mass Index: 42.07 kg/m      Reproductive/Obstetrics negative OB ROS                              Anesthesia Physical Anesthesia Plan  ASA: 3  Anesthesia Plan: General   Post-op Pain Management:    Induction: Intravenous  PONV Risk Score and Plan: Propofol infusion and TIVA  Airway Management Planned: Natural Airway and Nasal Cannula  Additional Equipment:   Intra-op Plan:   Post-operative Plan:   Informed Consent: I have reviewed the patients History and Physical, chart, labs and discussed the procedure including the risks, benefits and alternatives for the proposed anesthesia with the patient or authorized representative who has indicated his/her understanding and acceptance.     Dental Advisory Given  Plan Discussed with: Anesthesiologist, CRNA and Surgeon  Anesthesia Plan Comments: (Patient consented for risks of anesthesia including but not limited to:  - adverse reactions to medications - risk of airway placement if required - damage to eyes, teeth, lips or other oral mucosa - nerve damage due to positioning  - sore throat or hoarseness - Damage to heart, brain, nerves, lungs, other parts of body or loss of life  Patient voiced understanding.)        Anesthesia Quick Evaluation

## 2022-06-08 NOTE — Interval H&P Note (Signed)
History and Physical Interval Note: Preprocedure H&P from 06/08/22  was reviewed and there was no interval change after seeing and examining the patient.  Written consent was obtained from the patient after discussion of risks, benefits, and alternatives. Patient has consented to proceed with Esophagogastroduodenoscopy and Colonoscopy with possible intervention   06/08/2022 7:34 AM  Cynthia Mills  has presented today for surgery, with the diagnosis of Barrett's esophagus without dysplasia (K22.70) Hx of adenomatous polyp of colon (Z86.010) Pharyngoesophageal dysphagia (R13.14).  The various methods of treatment have been discussed with the patient and family. After consideration of risks, benefits and other options for treatment, the patient has consented to  Procedure(s): COLONOSCOPY (N/A) ESOPHAGOGASTRODUODENOSCOPY (EGD) (N/A) as a surgical intervention.  The patient's history has been reviewed, patient examined, no change in status, stable for surgery.  I have reviewed the patient's chart and labs.  Questions were answered to the patient's satisfaction.     [name]

## 2022-06-08 NOTE — Transfer of Care (Signed)
Immediate Anesthesia Transfer of Care Note  Patient: Cynthia Mills  Procedure(s) Performed: COLONOSCOPY ESOPHAGOGASTRODUODENOSCOPY (EGD)  Patient Location: PACU and Endoscopy Unit  Anesthesia Type:General  Level of Consciousness: awake  Airway & Oxygen Therapy: Patient Spontanous Breathing  Post-op Assessment: Report given to RN and Post -op Vital signs reviewed and stable  Post vital signs: Reviewed and stable  Last Vitals:  Vitals Value Taken Time  BP 96/70 06/08/22 0820  Temp    Pulse 75 06/08/22 0819  Resp 15 06/08/22 0819  SpO2 95 % 06/08/22 0819  Vitals shown include unvalidated device data.  Last Pain:  Vitals:   06/08/22 0818  TempSrc:   PainSc: 0-No pain         Complications: No notable events documented.

## 2022-06-08 NOTE — Anesthesia Postprocedure Evaluation (Signed)
Anesthesia Post Note  Patient: Cynthia Mills  Procedure(s) Performed: COLONOSCOPY ESOPHAGOGASTRODUODENOSCOPY (EGD)  Patient location during evaluation: Endoscopy Anesthesia Type: General Level of consciousness: awake and alert Pain management: pain level controlled Vital Signs Assessment: post-procedure vital signs reviewed and stable Respiratory status: spontaneous breathing, nonlabored ventilation, respiratory function stable and patient connected to nasal cannula oxygen Cardiovascular status: blood pressure returned to baseline and stable Postop Assessment: no apparent nausea or vomiting Anesthetic complications: no   No notable events documented.   Last Vitals:  Vitals:   06/08/22 0819 06/08/22 0836  BP:  101/61  Pulse:    Resp:    Temp: (!) 35.6 C   SpO2:      Last Pain:  Vitals:   06/08/22 0836  TempSrc:   PainSc: 0-No pain                 [name] [name]

## 2022-06-08 NOTE — Op Note (Signed)
[facility] Gastroenterology Patient Name: Cynthia Mills Procedure Date: 06/08/2022 7:16 AM MRN: [id number] Account #: [id number] Date of Birth: Aug 07, 1966 Admit Type: Outpatient Age: 56 Room: [facility] ENDO ROOM 1 Gender: Female Note Status: Finalized Instrument Name: Upper Endoscope [id number] Procedure:             Upper GI endoscopy Indications:           Surveillance for malignancy due to personal history of                         Barrett's esophagus Providers:             [name] DO, DO Referring MD:          Precious Bard, MD (Referring MD) Medicines:             Monitored Anesthesia Care Complications:         No immediate complications. Estimated blood loss:                         Minimal. Procedure:             Pre-Anesthesia Assessment:                        - Prior to the procedure, a History and Physical was                         performed, and patient medications and allergies were                         reviewed. The patient is competent. The risks and                         benefits of the procedure and the sedation options and                         risks were discussed with the patient. All questions                         were answered and informed consent was obtained.                         Patient identification and proposed procedure were                         verified by the physician, the nurse, the anesthetist                         and the technician in the endoscopy suite. Mental                         Status Examination: alert and oriented. Airway                         Examination: normal oropharyngeal airway and neck                         mobility. Respiratory Examination: clear to  auscultation. CV Examination: RRR, no murmurs, no S3                         or S4. Prophylactic Antibiotics: The patient does not                         require prophylactic antibiotics. Prior                          Anticoagulants: The patient has taken no previous                         anticoagulant or antiplatelet agents. ASA Grade                         Assessment: III - A patient with severe systemic                         disease. After reviewing the risks and benefits, the                         patient was deemed in satisfactory condition to                         undergo the procedure. The anesthesia plan was to use                         monitored anesthesia care (MAC). Immediately prior to                         administration of medications, the patient was                         re-assessed for adequacy to receive sedatives. The                         heart rate, respiratory rate, oxygen saturations,                         blood pressure, adequacy of pulmonary ventilation, and                         response to care were monitored throughout the                         procedure. The physical status of the patient was                         re-assessed after the procedure.                        After obtaining informed consent, the endoscope was                         passed under direct vision. Throughout the procedure,                         the patient's blood pressure, pulse, and oxygen  saturations were monitored continuously. The                         Endosonoscope was introduced through the mouth, and                         advanced to the second part of duodenum. The upper GI                         endoscopy was accomplished without difficulty. The                         patient tolerated the procedure well. Findings:      The duodenal bulb, first portion of the duodenum and second portion of       the duodenum were normal. Estimated blood loss: none.      A medium amount of food (residue) was found on the greater curvature of       the stomach and in the gastric antrum. Estimated blood loss: none. This       did  obscure some visualization of the gastric lumen.      A small hiatal hernia was present. Estimated blood loss: none.      The exam of the stomach was otherwise normal.      Esophagogastric landmarks were identified: the gastroesophageal junction       was found at 35 cm from the incisors.      There were esophageal mucosal changes secondary to established       short-segment Barrett's disease present at the gastroesophageal       junction. The maximum longitudinal extent of these mucosal changes was 1       cm in length. Mucosa was biopsied with a cold forceps for histology. One       specimen bottle was sent to pathology. Biopsied in a quadrant fashion.       Nodular area also biopsied. Estimated blood loss was minimal.      The exam of the esophagus was otherwise normal. Impression:            - Normal duodenal bulb, first portion of the duodenum                         and second portion of the duodenum.                        - A medium amount of food (residue) in the stomach.                        - Small hiatal hernia.                        - Esophagogastric landmarks identified.                        - Esophageal mucosal changes secondary to established                         short-segment Barrett's disease. Biopsied. Recommendation:        - Discharge patient to home.                        -  Resume previous diet.                        - Continue present medications.                        - Await pathology results.                        - Repeat upper endoscopy for surveillance based on                         pathology results.                        - Return to GI office as previously scheduled.                        - The findings and recommendations were discussed with                         the patient.                        - Proceed with colonoscopy Procedure Code(s):     --- Professional ---                        [id number], Esophagogastroduodenoscopy, flexible,                          transoral; with biopsy, single or multiple Diagnosis Code(s):     --- Professional ---                        K22.70, Barrett's esophagus without dysplasia                        K44.9, Diaphragmatic hernia without obstruction or                         gangrene CPT copyright 2019 American Medical Association. All rights reserved. The codes documented in this report are preliminary and upon coder review may  be revised to meet current compliance requirements. Attending Participation:      I personally performed the entire procedure. Volney American, DO [name] DO, DO 06/08/2022 7:53:32 AM This report has been signed electronically. Number of Addenda: 0 Note Initiated On: 06/08/2022 7:16 AM Estimated Blood Loss:  Estimated blood loss was minimal.      [facility]

## 2022-06-08 NOTE — Anesthesia Procedure Notes (Signed)
Procedure Name: MAC Date/Time: 06/08/2022 7:36 AM  Performed by: Biagio Borg, CRNAPre-anesthesia Checklist: Patient identified, Emergency Drugs available, Suction available, Patient being monitored and Timeout performed Patient Re-evaluated:Patient Re-evaluated prior to induction Oxygen Delivery Method: Nasal cannula Induction Type: IV induction Placement Confirmation: positive ETCO2 and CO2 detector

## 2022-06-08 NOTE — H&P (Signed)
Pre-Procedure H&P   Patient ID: Cynthia Mills is a 56 y.o. female.  Gastroenterology Provider: [name], DO  Referring Provider: Octavia Bruckner, PA PCP: Marinda Elk, MD  Date: 06/08/2022  HPI Ms. Cynthia Mills is a 56 y.o. female who presents today for Esophagogastroduodenoscopy and Colonoscopy for Surveillance-Barrett's esophagus and colon polyps. Patient's last esophagogastroduodenoscopy in October 2020 demonstrating 2 cm hiatal hernia, short segment Barrett's esophagus without dysplasia.  Negative for H. pylori.  She takes 20 mg of Prilosec daily without any reflux symptoms or odynophagia.  Reports she occasionally has some dry foods sticking, but no other dysphagia sx.  Underwent EGD and colonoscopy in January 2017 again demonstrating Barrett's esophagus without dysplasia.  Negative for celiac at that time.  1 tubular adenoma demonstrated with sigmoid diverticulosis and internal hemorrhoids.  Most recent lab work CRP 21 ESR 40 A1c 5.8 creatinine 0.9 hemoglobin 13 MCV 80 platelets 344,000  Past Medical History:  Diagnosis Date   Allergic rhinitis    Atypical mole 06/02/2018   right spinal mid upper back    Barrett esophagus    GERD (gastroesophageal reflux disease)    Hyperlipemia    PCOS (polycystic ovarian syndrome)    Plantar fasciitis    Sleep apnea    Vitamin D deficiency     Past Surgical History:  Procedure Laterality Date   COLONOSCOPY WITH PROPOFOL N/A 01/09/2016   Procedure: COLONOSCOPY WITH PROPOFOL;  Surgeon: Manya Silvas, MD;  Location: [place];  Service: Endoscopy;  Laterality: N/A;   ESOPHAGOGASTRODUODENOSCOPY (EGD) WITH PROPOFOL N/A 01/09/2016   Procedure: ESOPHAGOGASTRODUODENOSCOPY (EGD) WITH PROPOFOL;  Surgeon: Manya Silvas, MD;  Location: [facility] ENDOSCOPY;  Service: Endoscopy;  Laterality: N/A;    Family History No h/o GI disease or malignancy  Review of Systems  Constitutional:  Negative for  activity change, appetite change, chills, diaphoresis, fatigue, fever and unexpected weight change.  HENT:  Positive for trouble swallowing. Negative for voice change.   Respiratory:  Negative for shortness of breath and wheezing.   Cardiovascular:  Negative for chest pain, palpitations and leg swelling.  Gastrointestinal:  Negative for abdominal distention, abdominal pain, anal bleeding, blood in stool, constipation, diarrhea, nausea, rectal pain and vomiting.  Musculoskeletal:  Negative for arthralgias and myalgias.  Skin:  Negative for color change and pallor.  Neurological:  Negative for dizziness, syncope and weakness.  Psychiatric/Behavioral:  Negative for confusion.   All other systems reviewed and are negative.    Medications No current facility-administered medications on file prior to encounter.   Current Outpatient Medications on File Prior to Encounter  Medication Sig Dispense Refill   Omega-3 Fatty Acids (OMEGA 3 500) 500 MG CAPS Take by mouth.     Semaglutide-Weight Management (WEGOVY) 0.5 MG/0.5ML SOAJ Inject 0.5 mg into the skin once a week.     thiamine 100 MG tablet Take 100 mg by mouth daily.     Azelaic Acid (FINACEA) 15 % gel Apply 1 application topically 2 (two) times daily. After skin is thoroughly washed and patted dry, gently but thoroughly massage a thin film of azelaic acid gel into the affected area twice daily, in the morning and evening. 50 g 1   B Complex-C (B-COMPLEX WITH VITAMIN C) tablet Take 1 tablet by mouth daily.     Barberry-Oreg Grape-Goldenseal (BERBERINE COMPLEX PO) Take 500 mg by mouth in the morning, at noon, and at bedtime.     cetirizine (ZYRTEC) 10 MG tablet Take by mouth.  cholecalciferol (VITAMIN D) 1000 units tablet Take 1,000 Units by mouth daily.     Inositol-D Chiro-Inositol (OVASITOL) 2000-50 MG PACK Take 1 tablet by mouth in the morning and at bedtime.     Multiple Vitamin (MULTI-VITAMINS) TABS Take by mouth.     nitroGLYCERIN  (NITRO-DUR) 0.2 mg/hr patch Place 1 patch (0.2 mg total) onto the skin daily. 30 patch 12   omeprazole (PRILOSEC) 40 MG capsule Take 40 mg by mouth daily.     predniSONE (STERAPRED UNI-PAK 21 TAB) 10 MG (21) TBPK tablet Take 6 tablets by mouth today then 5 tablets tomorrow then one less tablet every day there after. 21 tablet 0    Pertinent medications related to GI and procedure were reviewed by me with the patient prior to the procedure   Current Facility-Administered Medications:    0.9 %  sodium chloride infusion, , Intravenous, Continuous, [name], DO      No Known Allergies Allergies were reviewed by me prior to the procedure  Objective   Body mass index is 42.07 kg/m. Vitals:   06/08/22 0707  BP: (!) 143/94  Pulse: 82  Resp: 18  Temp: 98.6 F (37 C)  TempSrc: Temporal  SpO2: 100%  Weight: 104.3 kg  Height: _0  (1.575 m)    Physical Exam Vitals and nursing note reviewed.  Constitutional:      General: She is not in acute distress.    Appearance: Normal appearance. She is obese. She is not ill-appearing, toxic-appearing or diaphoretic.  HENT:     Head: Normocephalic and atraumatic.     Nose: Nose normal.     Mouth/Throat:     Mouth: Mucous membranes are moist.     Pharynx: Oropharynx is clear.  Eyes:     General: No scleral icterus.    Extraocular Movements: Extraocular movements intact.  Cardiovascular:     Rate and Rhythm: Normal rate and regular rhythm.     Heart sounds: Normal heart sounds. No murmur heard.    No friction rub. No gallop.  Pulmonary:     Effort: Pulmonary effort is normal. No respiratory distress.     Breath sounds: Normal breath sounds. No wheezing, rhonchi or rales.  Abdominal:     General: Bowel sounds are normal. There is no distension.     Palpations: Abdomen is soft.     Tenderness: There is no abdominal tenderness. There is no guarding or rebound.  Musculoskeletal:     Cervical back: Neck supple.     Right  lower leg: No edema.     Left lower leg: No edema.  Skin:    General: Skin is warm and dry.     Coloration: Skin is not jaundiced or pale.  Neurological:     General: No focal deficit present.     Mental Status: She is alert and oriented to person, place, and time. Mental status is at baseline.  Psychiatric:        Mood and Affect: Mood normal.        Behavior: Behavior normal.        Thought Content: Thought content normal.        Judgment: Judgment normal.      Assessment:  Ms. Cynthia Mills is a 56 y.o. female  who presents today for Esophagogastroduodenoscopy and Colonoscopy for Surveillance-Barrett's esophagus and colon polyps.  Plan:  Esophagogastroduodenoscopy and Colonoscopy with possible intervention today  Esophagogastroduodenoscopy and Colonoscopy with possible biopsy, control of bleeding, polypectomy, and  interventions as necessary has been discussed with the patient/patient representative. Informed consent was obtained from the patient/patient representative after explaining the indication, nature, and risks of the procedure including but not limited to death, bleeding, perforation, missed neoplasm/lesions, cardiorespiratory compromise, and reaction to medications. Opportunity for questions was given and appropriate answers were provided. Patient/patient representative has verbalized understanding is amenable to undergoing the procedure.   [name], DO  [facility] Gastroenterology  Portions of the record may have been created with voice recognition software. Occasional wrong-word or 'sound-a-like' substitutions may have occurred due to the inherent limitations of voice recognition software.  Read the chart carefully and recognize, using context, where substitutions may have occurred.

## 2022-06-11 ENCOUNTER — Encounter: Payer: Self-pay | Admitting: Gastroenterology

## 2022-06-11 LAB — SURGICAL PATHOLOGY

## 2022-06-15 ENCOUNTER — Ambulatory Visit: Payer: BC Managed Care – PPO | Admitting: Adult Health

## 2022-06-15 ENCOUNTER — Encounter: Payer: Self-pay | Admitting: Adult Health

## 2022-06-15 VITALS — BP 122/80 | HR 91 | Temp 97.1°F | Wt 231.4 lb

## 2022-06-15 DIAGNOSIS — J018 Other acute sinusitis: Secondary | ICD-10-CM

## 2022-06-15 DIAGNOSIS — R051 Acute cough: Secondary | ICD-10-CM

## 2022-06-15 MED ORDER — DOXYCYCLINE HYCLATE 100 MG PO TABS: 100.0000 mg | ORAL_TABLET | Freq: Two times a day (BID) | ORAL | 0 refills | Status: DC

## 2022-06-15 MED ORDER — BENZONATATE 100 MG PO CAPS: 100.0000 mg | ORAL_CAPSULE | Freq: Three times a day (TID) | ORAL | 0 refills | Status: DC | PRN

## 2022-06-15 MED ORDER — ALBUTEROL SULFATE HFA 108 (90 BASE) MCG/ACT IN AERS: 2.0000 | INHALATION_SPRAY | Freq: Four times a day (QID) | RESPIRATORY_TRACT | 0 refills | Status: DC | PRN

## 2022-07-11 ENCOUNTER — Other Ambulatory Visit: Payer: Self-pay | Admitting: Physician Assistant

## 2022-07-11 DIAGNOSIS — Z1231 Encounter for screening mammogram for malignant neoplasm of breast: Secondary | ICD-10-CM

## 2022-08-14 ENCOUNTER — Ambulatory Visit
Admission: RE | Admit: 2022-08-14 | Discharge: 2022-08-14 | Disposition: A | Discharge status: Home / Self Care | Payer: BC Managed Care – PPO | Source: Ambulatory Visit | Attending: Physician Assistant | Admitting: Physician Assistant

## 2022-08-14 DIAGNOSIS — Z1231 Encounter for screening mammogram for malignant neoplasm of breast: Secondary | ICD-10-CM

## 2022-08-17 ENCOUNTER — Other Ambulatory Visit: Payer: Self-pay | Admitting: Physician Assistant

## 2022-08-17 DIAGNOSIS — R928 Other abnormal and inconclusive findings on diagnostic imaging of breast: Secondary | ICD-10-CM

## 2022-08-28 ENCOUNTER — Other Ambulatory Visit: Payer: BC Managed Care – PPO

## 2022-08-28 ENCOUNTER — Other Ambulatory Visit: Payer: Self-pay | Admitting: Obstetrics and Gynecology

## 2022-08-28 DIAGNOSIS — R928 Other abnormal and inconclusive findings on diagnostic imaging of breast: Secondary | ICD-10-CM

## 2022-08-30 ENCOUNTER — Ambulatory Visit
Admission: RE | Admit: 2022-08-30 | Discharge: 2022-08-30 | Disposition: A | Discharge status: Home / Self Care | Payer: BC Managed Care – PPO | Source: Ambulatory Visit | Attending: Obstetrics and Gynecology | Admitting: Obstetrics and Gynecology

## 2022-08-30 DIAGNOSIS — R928 Other abnormal and inconclusive findings on diagnostic imaging of breast: Secondary | ICD-10-CM

## 2022-09-06 ENCOUNTER — Other Ambulatory Visit: Payer: BC Managed Care – PPO

## 2022-09-11 ENCOUNTER — Ambulatory Visit (INDEPENDENT_AMBULATORY_CARE_PROVIDER_SITE_OTHER): Payer: BC Managed Care – PPO | Admitting: Family Medicine

## 2022-09-11 DIAGNOSIS — E282 Polycystic ovarian syndrome: Secondary | ICD-10-CM

## 2022-09-11 NOTE — Progress Notes (Signed)
Telehealth Encounter (Email link to [email or web address] ) PCP: Ranae Pila, PA at [facility] ([place])  Therapist: Jeremy Johann, [place], [facility], [place] I connected with Olivea Sonnen (MRN [id number]) on 09/11/2022 by MyChart video-enabled, HIPAA-compliant telemedicine application, verified that I was speaking with the correct person using two identifiers, and that the patient was in a private environment conducive to confidentiality.  The patient agreed to proceed.  Persons participating in visit were patient and provider (registered dietitian) Kennith Center, PhD, RD, LDN, CEDRD.  Provider was located at [place] during this telehealth encounter; patient was at home.  Appt start time: 1330 end time: 1430 (1 hour)  Reason for telehealth visit: Referred by therapist Jeremy Johann for Medical Nutrition Therapy related to weight management and PCOS.    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  She has a h/o PCOS and obesity.  Has "always" had elevated CRP.  She was seen by RD Lynden Ang in 2018 for PCOS and A1c of 5.8 at that time.  Has seen RD York Ram extensively, exploring nutritional aspects of PCOS and body acceptance.  Ryen has been talking with Jeremy Johann about intuitive eating.  She started metformin in 2016, but discontinued it in March 2021 when she started a Berberine trial through the [facility].  Sentoria has continued taking Berberine, and her A1c has held pretty steady, while not causing GI problems she experienced with metformin.   Assessment: Jisela is 5 weeks into a new semester, after having worked over the summer as well.  Weight loss has slowed, but she has lost 45 lb since starting semaglutide  in mid-February (219 lb this morning).  Currently taking 2.4 mg Wegovy.  Nausea has improved, although there are some days when she struggles to eat much at all, including getting adequate  protein.   Shebra saw a rheumatologist July 31.  Blood work at that appt showed no abnormal results other than elevated CRP, which the rheumatologist felt is related to PCOS as well as Barrett's esophagus.   Takiyah is seldom tracking Weight Watchers points now (daily target of 45).  Trying to find the balance of appropriate nutritional concern/focus.    Usual eating pattern: 3 meals and 0-2 snacks per day.  Has continued making a weekly menu most Sundays.   Usual physical activity: Has not yet incorporated strength training, but is walking ~1.5 mi min 5-7 X wk.   Sleep: Estimates ~7 hrs/night   Using C-PAP consistently as well as sleep mask to block  ambient light.   24-hr recall:  (Up at 6:30 AM) B (7:30 AM)-  1/2 [employer] Core Power pro shake (21 g pro, 120 kcal), 1/2 lite Eng muffin Snk (10 AM)-  4 oz hot tea L (1 PM)-  1/2 sandwich: [place], avocado, chs, [place] bread, peach fruit cup, water Snk ( PM)-  --- D (6:30 PM)-  1/2 chx breast, 1/2 c green beans, 4 oz sweet tea Snk (8:30PM) 2 mini cc cookies, 4 oz sk milk Snak ( 10 )-  1/2 [employer] Core Power pro shake (21 g pro, 120 kcal); gets aft snak ~50% of time.   Typical day? No.  Seldom has evening snack or sweet tea.   Intervention: Completed diet and exercise history, modified behavioral goals.  For recommendations and goals, see Patient Instructions.    Follow-up: Telehealth appt in 3 months.   Quantarius Genrich,[name]

## 2022-09-11 NOTE — Patient Instructions (Addendum)
Goals:  1. Get at least 10 min strength training 2 times per week.       - Write down a few different workout options.       - Furniture conservator/restorer your list of workouts for feedback.   2. Obtain at least 20 grams of protein per meal.       Generally, aim for at least 3 ounces of meat or fish or at least 1 full cup of beans.  (1 cup of most beans = 2 oz of meat.) - 1 ounce of meat, fish, poultry, or cheese or 1 egg provides about 7 grams of protein.  - 8 oz of milk or 2 tbsp peanut butter provides 8 grams of protein. - 1 cup (8 oz) of most beans provides 15 grams of protein.    - Yogurts vary greatly, but [place] yogurt is highest in protein.    3. Obtain at least 48 oz of water per day.  (Unsweetened tea counts.)    - Consider tying at least a full water bottle of water to your strength workout.     - Can you figure out another activity during your normal day that you can link water intake with?   Document progress on above goals using Goals Tracker provided today or other method that works for you (e.g., journal).    Follow-up: Video visit on Tuesday, December 12 at 11 AM.

## 2022-10-30 ENCOUNTER — Encounter: Payer: Self-pay | Admitting: Dermatology

## 2022-10-30 ENCOUNTER — Ambulatory Visit: Payer: BC Managed Care – PPO | Admitting: Dermatology

## 2022-10-30 DIAGNOSIS — D239 Other benign neoplasm of skin, unspecified: Secondary | ICD-10-CM

## 2022-10-30 DIAGNOSIS — L578 Other skin changes due to chronic exposure to nonionizing radiation: Secondary | ICD-10-CM

## 2022-10-30 DIAGNOSIS — D2371 Other benign neoplasm of skin of right lower limb, including hip: Secondary | ICD-10-CM

## 2022-10-30 DIAGNOSIS — D229 Melanocytic nevi, unspecified: Secondary | ICD-10-CM

## 2022-10-30 DIAGNOSIS — Z86018 Personal history of other benign neoplasm: Secondary | ICD-10-CM

## 2022-10-30 DIAGNOSIS — D2261 Melanocytic nevi of right upper limb, including shoulder: Secondary | ICD-10-CM | POA: Diagnosis not present

## 2022-10-30 DIAGNOSIS — D171 Benign lipomatous neoplasm of skin and subcutaneous tissue of trunk: Secondary | ICD-10-CM

## 2022-10-30 DIAGNOSIS — L719 Rosacea, unspecified: Secondary | ICD-10-CM

## 2022-10-30 DIAGNOSIS — D2361 Other benign neoplasm of skin of right upper limb, including shoulder: Secondary | ICD-10-CM

## 2022-10-30 DIAGNOSIS — L814 Other melanin hyperpigmentation: Secondary | ICD-10-CM

## 2022-10-30 DIAGNOSIS — Z1283 Encounter for screening for malignant neoplasm of skin: Secondary | ICD-10-CM | POA: Diagnosis not present

## 2022-10-30 DIAGNOSIS — D2372 Other benign neoplasm of skin of left lower limb, including hip: Secondary | ICD-10-CM | POA: Diagnosis not present

## 2022-10-30 DIAGNOSIS — Q825 Congenital non-neoplastic nevus: Secondary | ICD-10-CM

## 2022-10-30 DIAGNOSIS — D235 Other benign neoplasm of skin of trunk: Secondary | ICD-10-CM

## 2022-10-30 DIAGNOSIS — L821 Other seborrheic keratosis: Secondary | ICD-10-CM

## 2022-10-30 MED ORDER — RHOFADE 1 % EX CREA: TOPICAL_CREAM | CUTANEOUS | 5 refills | Status: DC

## 2022-10-30 MED ORDER — AZELAIC ACID 15 % EX GEL: 1.0000 "application " | Freq: Two times a day (BID) | CUTANEOUS | 5 refills | Status: DC

## 2022-10-30 NOTE — Patient Instructions (Addendum)
Melanoma ABCDEs  Melanoma is the most dangerous type of skin cancer, and is the leading cause of death from skin disease.  You are more likely to develop melanoma if you: Have light-colored skin, light-colored eyes, or red or blond hair Spend a lot of time in the sun Tan regularly, either outdoors or in a tanning bed Have had blistering sunburns, especially during childhood Have a close family member who has had a melanoma Have atypical moles or large birthmarks  Early detection of melanoma is key since treatment is typically straightforward and cure rates are extremely high if we catch it early.   The first sign of melanoma is often a change in a mole or a new dark spot.  The ABCDE system is a way of remembering the signs of melanoma.  A for asymmetry:  The two halves do not match. B for border:  The edges of the growth are irregular. C for color:  A mixture of colors are present instead of an even brown color. D for diameter:  Melanomas are usually (but not always) greater than 90m - the size of a pencil eraser. E for evolution:  The spot keeps changing in size, shape, and color.  Please check your skin once per month between visits. You can use a small mirror in front and a large mirror behind you to keep an eye on the back side or your body.   If you see any new or changing lesions before your next follow-up, please call to schedule a visit.  Please continue daily skin protection including broad spectrum sunscreen SPF 30+ to sun-exposed areas, reapplying every 2 hours as needed when you're outdoors.   Staying in the shade or wearing long sleeves, sun glasses (UVA+UVB protection) and wide brim hats (4-inch brim around the entire circumference of the hat) are also recommended for sun protection.    Rosacea  What is rosacea? Rosacea (say: ro-zay-sha) is a common skin disease that usually begins as a trend of flushing or blushing easily.  As rosacea progresses, a persistent redness in  the center of the face will develop and may gradually spread beyond the nose and cheeks to the forehead and chin.  In some cases, the ears, chest, and back could be affected.  Rosacea may appear as tiny blood vessels or small red bumps that occur in crops.  Frequently they can contain pus, and are called "pustules".  If the bumps do not contain pus, they are referred to as "papules".  Rarely, in prolonged, untreated cases of rosacea, the oil glands of the nose and cheeks may become permanently enlarged.  This is called rhinophyma, and is seen more frequently in men.  Signs and Risks In its beginning stages, rosacea tends to come and go, which makes it difficult to recognize.  It can start as intermittent flushing of the face.  Eventually, blood vessels may become permanently visible.  Pustules and papules can appear, but can be mistaken for adult acne.  People of all races, ages, genders and ethnic groups are at risk of developing rosacea.  However, it is more common in women (especially around menopause) and adults with fair skin between the ages of 314and 544  Treatment Dermatologists typically recommend a combination of treatments to effectively manage rosacea.  Treatment can improve symptoms and may stop the progression of the rosacea.  Treatment may involve both topical and oral medications.  The tetracycline antibiotics are often used for their anti-inflammatory effect; however, because of  the possibility of developing antibiotic resistance, they should not be used long term at full dose.  For dilated blood vessels the options include electrodessication (uses electric current through a small needle), laser treatment, and cosmetics to hide the redness.   With all forms of treatment, improvement is a slow process, and patients may not see any results for the first 3-4 weeks.  It is very important to avoid the sun and other triggers.  Patients must wear sunscreen daily.  Skin Care Instructions: Cleanse  the skin with a mild soap such as CeraVe cleanser, Cetaphil cleanser, or Dove soap once or twice daily as needed. Moisturize with Eucerin Redness Relief Daily Perfecting Lotion (has a subtle green tint), CeraVe Moisturizing Cream, or Oil of Olay Daily Moisturizer with sunscreen every morning and/or night as recommended. Makeup should be "non-comedogenic" (won't clog pores) and be labeled "for sensitive skin". Good choices for cosmetics are: Neutrogena, Almay, and Physician's Formula.  Any product with a green tint tends to offset a red complexion. If your eyes are dry and irritated, use artificial tears 2-3 times per day and cleanse the eyelids daily with baby shampoo.  Have your eyes examined at least every 2 years.  Be sure to tell your eye doctor that you have rosacea. Alcoholic beverages tend to cause flushing of the skin, and may make rosacea worse. Always wear sunscreen, protect your skin from extreme hot and cold temperatures, and avoid spicy foods, hot drinks, and mechanical irritation such as rubbing, scrubbing, or massaging the face.  Avoid harsh skin cleansers, cleansing masks, astringents, and exfoliation. If a particular product burns or makes your face feel tight, then it is likely to flare your rosacea. If you are having difficulty finding a sunscreen that you can tolerate, you may try switching to a chemical-free sunscreen.  These are ones whose active ingredient is zinc oxide or titanium dioxide only.  They should also be fragrance free, non-comedogenic, and labeled for sensitive skin. Rosacea triggers may vary from person to person.  There are a variety of foods that have been reported to trigger rosacea.  Some patients find that keeping a diary of what they were doing when they flared helps them avoid triggers.    Due to recent changes in healthcare laws, you may see results of your pathology and/or laboratory studies on MyChart before the doctors have had a chance to review them. We  understand that in some cases there may be results that are confusing or concerning to you. Please understand that not all results are received at the same time and often the doctors may need to interpret multiple results in order to provide you with the best plan of care or course of treatment. Therefore, we ask that you please give [place] 2 business days to thoroughly review all your results before contacting the office for clarification. Should we see a critical lab result, you will be contacted sooner.   If You Need Anything After Your Visit  If you have any questions or concerns for your doctor, please call our main line at [phone] and press option 4 to reach your doctor's medical assistant. If no one answers, please leave a voicemail as directed and we will return your call as soon as possible. Messages left after 4 pm will be answered the following business day.   You may also send [place] a message via [facility]. We typically respond to MyChart messages within 1-2 business days.  For prescription refills, please ask your pharmacy  to contact our office. Our fax number is [phone].  If you have an urgent issue when the clinic is closed that cannot wait until the next business day, you can page your doctor at the number below.    Please note that while we do our best to be available for urgent issues outside of office hours, we are not available 24/7.   If you have an urgent issue and are unable to reach [place], you may choose to seek medical care at your doctor's office, retail clinic, urgent care center, or emergency room.  If you have a medical emergency, please immediately call 911 or go to the emergency department.  Pager Numbers  - Dr. Nehemiah Massed: [phone]  - Dr. Laurence Ferrari: [phone]  - Dr. Nicole Kindred: [phone]  In the event of inclement weather, please call our main line at [phone] for an update on the status of any delays or closures.  Dermatology Medication Tips: Please  keep the boxes that topical medications come in in order to help keep track of the instructions about where and how to use these. Pharmacies typically print the medication instructions only on the boxes and not directly on the medication tubes.   If your medication is too expensive, please contact our office at [phone] option 4 or send [place] a message through [place].   We are unable to tell what your co-pay for medications will be in advance as this is different depending on your insurance coverage. However, we may be able to find a substitute medication at lower cost or fill out paperwork to get insurance to cover a needed medication.   If a prior authorization is required to get your medication covered by your insurance company, please allow [place] 1-2 business days to complete this process.  Drug prices often vary depending on where the prescription is filled and some pharmacies may offer cheaper prices.  The website www.goodrx.com contains coupons for medications through different pharmacies. The prices here do not account for what the cost may be with help from insurance (it may be cheaper with your insurance), but the website can give you the price if you did not use any insurance.  - You can print the associated coupon and take it with your prescription to the pharmacy.  - You may also stop by our office during regular business hours and pick up a GoodRx coupon card.  - If you need your prescription sent electronically to a different pharmacy, notify our office through [facility] or by phone at [phone] option 4.     Si Usted Necesita Algo Despus de Su Visita  Tambin puede enviarnos un mensaje a travs de Pharmacist, community. Por lo general respondemos a los mensajes de MyChart en el transcurso de 1 a 2 das hbiles.  Para renovar recetas, por favor pida a su farmacia que se ponga en contacto con nuestra oficina. Harland Dingwall de fax es [place] [phone].  Si tiene un asunto urgente  cuando la clnica est cerrada y que no puede esperar hasta el siguiente da hbil, puede llamar/localizar a su doctor(a) al nmero que aparece a continuacin.   Por favor, tenga en cuenta que aunque hacemos todo lo posible para estar disponibles para asuntos urgentes fuera del horario de [place], no estamos disponibles las 24 horas del da, los 7 das de la [place].   Si tiene un problema urgente y no puede comunicarse con nosotros, puede optar por buscar atencin mdica  en el consultorio de su doctor(a), en [facility]  clnica privada, en un centro de atencin urgente o en una sala de emergencias.  Si tiene Engineering geologist, por favor llame inmediatamente al 911 o vaya a la sala de emergencias.  Nmeros de bper  - Dr. Nehemiah Massed: [phone]  - Dra. Moye: [phone]  - Dra. Nicole Kindred: [phone]  En caso de inclemencias del [place], por favor llame a Johnsie Kindred principal al [phone] para una actualizacin sobre el [place] de cualquier retraso o cierre.  Consejos para la medicacin en dermatologa: Por favor, guarde las cajas en las que vienen los medicamentos de uso tpico para ayudarle a seguir las instrucciones sobre dnde y cmo usarlos. Las farmacias generalmente imprimen las instrucciones del medicamento slo en las cajas y no directamente en los tubos del [place].   Si su medicamento es muy caro, por favor, pngase en contacto con Zigmund Daniel llamando al [phone] y presione la opcin 4 o envenos un mensaje a travs de Pharmacist, community.   No podemos decirle cul ser su copago por los medicamentos por adelantado ya que esto es diferente dependiendo de la cobertura de su seguro. Sin embargo, es posible que podamos encontrar un medicamento sustituto a Electrical engineer un formulario para que el seguro cubra el medicamento que se considera necesario.   Si se requiere una autorizacin previa para que su compaa de seguros [place] su medicamento, por favor permtanos de 1 a 2  das hbiles para completar este proceso.  Los precios de los medicamentos varan con frecuencia dependiendo del Environmental consultant de dnde se surte la receta y alguna farmacias pueden ofrecer precios ms baratos.  El sitio web www.goodrx.com tiene cupones para medicamentos de Airline pilot. Los precios aqu no tienen en cuenta lo que podra costar con la ayuda del seguro (puede ser ms barato con su seguro), pero el sitio web puede darle el precio si no utiliz Research scientist (physical sciences).  - Puede imprimir el cupn correspondiente y llevarlo con su receta a la farmacia.  - Tambin puede pasar por nuestra oficina durante el horario de atencin regular y Charity fundraiser una tarjeta de cupones de GoodRx.  - Si necesita que su receta se enve electrnicamente a una farmacia diferente, informe a nuestra oficina a travs de MyChart de [place] o por telfono llamando al [phone] y presione la opcin 4.

## 2022-10-30 NOTE — Progress Notes (Signed)
Follow-Up Visit   Subjective  Cynthia Mills is a 56 y.o. female who presents for the following: Annual Exam.  The patient presents for Total-Body Skin Exam (TBSE) for skin cancer screening and mole check.  The patient has spots, moles and lesions to be evaluated, some may be new or changing. She is using Rhofade and Azelaic Acid Gel as needed for rosacea. History of dysplastic nevus, mild.    The following portions of the chart were reviewed this encounter and updated as appropriate:       Review of Systems:  No other skin or systemic complaints except as noted in HPI or Assessment and Plan.  Objective  Well appearing patient in no apparent distress; mood and affect are within normal limits.  A full examination was performed including scalp, head, eyes, ears, nose, lips, neck, chest, axillae, abdomen, back, buttocks, bilateral upper extremities, bilateral lower extremities, hands, feet, fingers, toes, fingernails, and toenails. All findings within normal limits unless otherwise noted below.  Right Upper Arm 3 x 2 mm brown macule with notch  left lateral foot 2 mm blue macule  Right Upper Back Indistinct 8 cm soft rubbery mass   face Mild erythema of the cheeks, with telangiectasias.  right medial elbow 1.0 cm speckled brown papule    Assessment & Plan  Skin cancer screening performed today.  Actinic Damage - chronic, secondary to cumulative UV radiation exposure/sun exposure over time - diffuse scaly erythematous macules with underlying dyspigmentation - Recommend daily broad spectrum sunscreen SPF 30+ to sun-exposed areas, reapply every 2 hours as needed.  - Recommend staying in the shade or wearing long sleeves, sun glasses (UVA+UVB protection) and wide brim hats (4-inch brim around the entire circumference of the hat). - Call for new or changing lesions.  Lentigines - Scattered tan macules - Due to sun exposure - Benign-appearing, observe -  Recommend daily broad spectrum sunscreen SPF 30+ to sun-exposed areas, reapply every 2 hours as needed. - Call for any changes  Seborrheic Keratoses - Stuck-on, waxy, tan-brown papules and/or plaques  - Benign-appearing - Discussed benign etiology and prognosis. - Observe - Call for any changes  History of Dysplastic Nevus - No evidence of recurrence today of the right spinal mid upper back - Recommend regular full body skin exams - Recommend daily broad spectrum sunscreen SPF 30+ to sun-exposed areas, reapply every 2 hours as needed.  - Call if any new or changing lesions are noted between office visits  Hemangiomas - Red papules - Discussed benign nature - Observe - Call for any changes  Dermatofibroma - 1.1 cm Firm pink/brown papulenodule with dimple sign left upper arm, no changes -  firm pink/brown papulenodule with dimple sign left pretibia , right knee, right ankle , right upper back, right forearm  - Benign appearing - Call for any changes  Nevus Right Upper Arm  Benign-appearing, stable.  Observation.  Call clinic for new or changing moles.  Recommend daily use of broad spectrum spf 30+ sunscreen to sun-exposed areas.   Blue nevus left lateral foot  Benign-appearing. Stable. Observation.  Call clinic for new or changing moles.  Recommend daily use of broad spectrum spf 30+ sunscreen to sun-exposed areas.    Lipoma of back Right Upper Back  Asymptomatic  Benign, stable. Observe.   Rosacea face  Chronic and persistent condition with duration or expected duration over one year. Condition is symptomatic / bothersome to patient. Well-controlled.  Rosacea is a chronic progressive skin condition usually  affecting the face of adults, causing redness and/or acne bumps. It is treatable but not curable. It sometimes affects the eyes (ocular rosacea) as well. It may respond to topical and/or systemic medication and can flare with stress, sun exposure, alcohol, exercise,  topical steroids (including hydrocortisone/cortisone 10) and some foods.  Daily application of broad spectrum spf 30+ sunscreen to face is recommended to reduce flares.  Continue Rhofade Apply to face QAM prn. Continue Azelaic Acid Gel QD/BID prn.   Oxymetazoline HCl (RHOFADE) 1 % CREA - face Apply to face every morning for redness.  Congenital non-neoplastic nevus right medial elbow  Benign-appearing.  Observation.  Call clinic for new or changing lesions.  Recommend daily use of broad spectrum spf 30+ sunscreen to sun-exposed areas.     Return in about 1 year (around 10/31/2023) for TBSE.  IJamesetta Orleans, CMA, am acting as scribe for Brendolyn Patty, MD .   Documentation: I have reviewed the above documentation for accuracy and completeness, and I agree with the above.  Brendolyn Patty MD

## 2022-12-03 NOTE — Progress Notes (Unsigned)
Telehealth Encounter (Email link to [email or web address] ) PCP: Ranae Pila, PA at [facility] ([place])  Therapist: Jeremy Johann, [place], [facility], [place] I connected with Cynthia Mills (MRN [id number]) on 12/03/2022 by MyChart video-enabled, HIPAA-compliant telemedicine application, verified that I was speaking with the correct person using two identifiers, and that the patient was in a private environment conducive to confidentiality.  The patient agreed to proceed.  Persons participating in visit were patient and provider (registered dietitian) Kennith Center, PhD, RD, LDN, CEDRD.  Provider was located at [place] during this telehealth encounter; patient was at home.  Appt start time: 1000 end time: 1200 (1 hour)  Reason for telehealth visit: Referred by therapist Jeremy Johann for Medical Nutrition Therapy related to weight management and PCOS.    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  Has "always" had elevated CRP.  She was seen by RD Lynden Ang in 2018 for PCOS and A1c of 5.8 at that time.  Has seen RD York Ram extensively, exploring nutritional aspects of PCOS and body acceptance.  Cynthia Mills has been talking with Jeremy Johann about intuitive eating.  She started metformin in 2016, but discontinued it in March 2021 when she started a Berberine trial through the [facility].  Cynthia Mills has continued taking Berberine, and her A1c has held pretty steady, while not causing GI problems she experienced with metformin.   Assessment: Cynthia Mills is doing pretty well with respect to food choices and physical activity, althoguh she has not specifically met behavioral goals (see below).  She has lost a total of 53 lb, and has plateaued in weight since ~mid- or late-October. (Has lost from 265 lb in 2022 to 219 on 12-03-22.) She has been at the max dose (2.4 mg) of Wegovy for about 6  months.  Nausea has improved; experiences H/A and fatigue for first two days following injection; flatulence and constipation are also side effects she has experienced. Labs from today:  CBC - Normal or near-normal range A1c: 5.4 (5.8 in May 2023; 6.22 February 2021) Glucose: 108 (120 May 2023; 121 March 2022) TSH: 4.339  TC: 211 (174 in May 2023) TG: 85 (77 in May 2023; 160 March 2022) HDL: 54.3 (47.5 in May 2023) LDL: 140 (111 in May 2023; 122 March 2022) Ratio: 3.9 (3.7 in May 2023) (Rheumatologist attributes elevated CRP to PCOS)  Progress on goals:  Strength training:  Has not started.  20 g protein/meal: Stilll struggles some; meeting goal ~2 X day.  48 oz water/day:  not meeting goal.   Usual eating pattern: 3 meals and 0-2 snacks per day.   Usual physical activity: Walking 1.5 mi twice daily 6-7 X wk.  Sporadically does chair yoga at home.  Has just contacted a personal trainer, and hopes to meet 2 X wk.   Sleep: Estimates ~7 hrs/night   Using C-PAP consistently as well as sleep mask to block ambient light.   Did no food recall today.   Intervention: Completed diet and exercise history, and modified behavioral goals.  For recommendations and goals, see Patient Instructions.    Follow-up: Telehealth appt in 3 months.   Cynthia Mills,[name]

## 2022-12-04 ENCOUNTER — Ambulatory Visit (INDEPENDENT_AMBULATORY_CARE_PROVIDER_SITE_OTHER): Payer: BC Managed Care – PPO | Admitting: Family Medicine

## 2022-12-04 DIAGNOSIS — Z713 Dietary counseling and surveillance: Secondary | ICD-10-CM | POA: Diagnosis not present

## 2022-12-04 DIAGNOSIS — E282 Polycystic ovarian syndrome: Secondary | ICD-10-CM | POA: Diagnosis not present

## 2022-12-04 NOTE — Patient Instructions (Signed)
Lipids results today suggest it may be a good idea to use more extra virgin olive oil.  This mono-unsaturated fat helps to reduce LDL without reducing HDL.   Constipation: You may want to try up to 1 heaping tbsp of flax seed meal/day.  Start with just 1 teaspoon, and increase slowly and progressively.  (Keep this in your refrigerator.)  Yogurt: Aim for one with the most protein [place]), low- or non-fat, and low in sugar.  Try blending sweetened yogurt with plain.    Goals:  1. Get at least 10 min strength training 2 times per week.       - Write down a few different workout options.       - Furniture conservator/restorer your list of workouts for feedback.   2. Obtain at least 20 grams of protein per meal.       Generally, aim for at least 3 ounces of meat or fish or at least 1 full cup of beans.  (1 cup of most beans = 2 oz of meat.) - 1 ounce of meat, fish, poultry, or cheese or 1 egg provides about 7 grams of protein.  - 8 oz of milk or 2 tbsp peanut butter provides 8 grams of protein. - 1 cup (8 oz) of most beans provides 15 grams of protein.    - Yogurts vary greatly, but [place] yogurt is highest in protein.   3. Obtain at least 48 oz of water per day.  (Unsweetened tea counts.)    - Consider consuming at least 12 oz water BEFORE you walk in the morning and another 16 oz after walking.      - Can you figure out another activity during your normal day that you can link water intake with?   Document progress on above goals using Goals Tracker provided today.   Follow-up: Video visit on Monday, March 11 at 11 AM.

## 2023-03-04 ENCOUNTER — Ambulatory Visit: Payer: BC Managed Care – PPO | Admitting: Family Medicine

## 2023-03-18 ENCOUNTER — Ambulatory Visit (INDEPENDENT_AMBULATORY_CARE_PROVIDER_SITE_OTHER): Payer: BC Managed Care – PPO | Admitting: Family Medicine

## 2023-03-18 DIAGNOSIS — E282 Polycystic ovarian syndrome: Secondary | ICD-10-CM | POA: Diagnosis not present

## 2023-03-18 NOTE — Patient Instructions (Addendum)
Consider backing off the max dose of Wegovy to see if you can continue to have the positive effects without as many side effects (nausea).    Goals:  1. Continue ~40 min strength training 2 times per week.   2. Obtain at least 20 grams of protein per meal.   - 1 ounce of meat, fish, poultry, or cheese or 1 egg provides about 7 grams of protein.  - 8 oz of milk or 2 tbsp peanut butter provides 8 grams of protein. - 1 cup (8 oz) of most beans provides 15 grams of protein.    - Include protein foods for snacks as well; you'll have better protein utilization if you get small or moderate amounts more frequently during the day (vs larger amounts a couple times a day), and these smaller amounts may be more appealing to you.   - Higher-protein grains include quinoa, farro, and teff:  Survivor[employer].com.pt - You are in an enhanced muscle protein synthesis mode following exercise.  Take advantage of the synergistic effect of both this exercise-induced muscle protein synthesis and amino acid availability by making sure you meet your protein needs especially on exercise days! 3. Obtain at least 48 oz of water per day.  (Unsweetened tea counts.)    - Consider consuming at least 12 oz water BEFORE you walk in the morning and another 16 oz after walking.     Document progress on above goals using Goals Tracker provided today.   Follow-up: Video visit on Monday, June 10 at 11 AM.

## 2023-03-18 NOTE — Progress Notes (Signed)
Telehealth Encounter (Email link to [email or web address] ) PCP: Ranae Pila, PA at [facility] ([place])  Therapist: Jeremy Johann, [place], [facility], [place] I connected with Anelie Bowdish (MRN [id number]) on 03/18/2023 by MyChart video-enabled, HIPAA-compliant telemedicine application, verified that I was speaking with the correct person using two identifiers, and that the patient was in a private environment conducive to confidentiality.  The patient agreed to proceed.  Persons participating in visit were patient and provider (registered dietitian) Kennith Center, PhD, RD, LDN, CEDRD.  Provider was located at [place] during this telehealth encounter; patient was at home.  Appt start time: 1430 end time: 1530 (1 hour)  Reason for telehealth visit: Referred by therapist Jeremy Johann for Medical Nutrition Therapy related to weight management and PCOS.    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  She has a h/o PCOS and obesity.  Has "always" had elevated CRP.  She was seen by RD Lynden Ang in 2018 for PCOS and A1c of 5.8 at that time.  Has seen RD York Ram extensively, exploring nutritional aspects of PCOS and body acceptance.  Chiniqua has been talking with Jeremy Johann about intuitive eating.  She started metformin in 2016, but discontinued it in March 2021 when she started a Berberine trial through the [facility].  Kamera has continued taking Berberine, and her A1c has held pretty steady, while not causing GI problems she experienced with metformin.   Assessment: Terrilee's weight loss has mostly plateaued; ~9 lb lost in past 3 months.  (Has lost from 265 lb in 2022 to 210 in February 2023.)  Still using max dose (2.4 mg) of Wegovy, and still experiences nausea many days, making protein intake especially challenging.   No food recall today.   Progress on goals:  Strength training:  Weight training ~40 min 2  X wk with a personal trainer or small group.   20 g protein/meal:  Meeting goal ~4 X wk.  48 oz water/day:  Meeting goal most days; gets 20 oz per workout; difficult on teaching days.   Usual eating pattern:  3 meals and 0-1 snack per day.   Usual physical activity: Walking ~2 mi twice daily 6-7 X wk and working out with personal trainer ~40 min 2 X wk.   Sleep: Estimates ~7 hrs/night     Intervention: Completed diet and exercise history, and slightly modified behavioral goals.  For recommendations and goals, see Patient Instructions.    Follow-up: Telehealth appt in 3 months.   Sarahann Horrell,[name]

## 2023-05-30 NOTE — Progress Notes (Signed)
Telehealth Encounter  PCP: Crista Curb, PA at [facility] ([facility])  Therapist: Mike Craze, MA, [facility], [facility] I connected with Cynthia Mills (MRN [id number]) on 06/03/2023 by MyChart video-enabled, HIPAA-compliant telemedicine application, verified that I was speaking with the correct person using two identifiers, and that the patient was in a private environment conducive to confidentiality.  The patient agreed to proceed.  Persons participating in visit were patient and provider (registered dietitian) Cynthia Darner, PhD, RD, LDN, CEDRD.  Provider was located at [facility] during this telehealth encounter; patient was at home.  Appt start time: 1100 end time: 1200 (1 hour)  Reason for telehealth visit: Referred by therapist Cynthia Mills for Medical Nutrition Therapy related to weight management and PCOS.    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  Has "always" had elevated CRP.  She was seen by RD Cynthia Mills in 2018 for PCOS and A1c of 5.8 at that time.  Has seen RD Cynthia Mills extensively, exploring nutritional aspects of PCOS and body acceptance.  Cynthia Mills has been talking with Cynthia Mills about intuitive eating.  She started semaglutide Reginal Lutes) in Feb 2023, titrated up to max dose of 2.4 mg, which has helped to lower both weight and A1c.    Assessment: Cynthia Mills is still taking 2.4 mg of Wegovy.  Nausea has improved significantly. She also feels less fullness related to delayed gastric emptying, i.e., can eat a whole sandwich now.  She has not been documenting progress on behavioral goals.  She has lost a total of >50 lb since starting Wegovy; no further wt loss since Mar/April, although she would like to be 170s to 180s.  She can tell that her appetite increases some as she gets closer to the end of the week after her injection.  Her mental "food chatter" has increased a  bit as she anticipates upcoming vacation.  Last A1c was 5.4% on 12/05/23.   Weight: 215 lb (self-report); ht is 62.5". Usual eating pattern:  3 meals and 0-1 snack per day.   Usual physical activity: Walking ~2 mi twice daily 6-7 X wk and working out with personal trainer ~40 min 2-3 X wk.   Sleep: Estimates ~7 hrs/night, although sometimes interrupted.    Estimated progress on goals:  Str trng 40 min 2 X wk:  40 min 2-3 X wk with a personal trainer or small group.   20 g protein/meal:  Meeting goal ~4 X wk. Trying to include protein in snacks, e.g., nuts/cheese.   48 oz water/day:  Meeting goal at least 3 X wk, esp on workout days.    24-hr recall suggests intake of ~1500 kcal and 75-80 g protein:  (Up at 8 AM) B (9 AM)-  1/2 protein shake (21 g protein, 115 kcal)   115 Snk (11:30)-   2 eggs, 2 slc bacon, 1 slc coffee cake, 1 clementine, 2 c hot tea w/ diet flvrd creamer&3 stevia L ( PM)-  ---        420 Snk (3 PM)-  1 slc bread, <1 oz cheese, 2 slc bacon, 1/2 apple, water 295 D (7 PM)-  3 slc chs pizza, diet iced tea      600 Snk (9 PM)-  16 oz tea, 2 c f-f flvrd creamer, 3 stevia, 2 mini-cc cookies   70 Typical day? No; this was more food than usual, and Cynthia Mills does not typically have  pizza or dessert.    Intervention: Completed diet and exercise history, and modified behavioral goals.  For recommendations and goals, see Patient Instructions.    Follow-up: Telehealth appt in 2 months.   Cynthia Mills,Cynthia Mills

## 2023-06-03 ENCOUNTER — Ambulatory Visit (INDEPENDENT_AMBULATORY_CARE_PROVIDER_SITE_OTHER): Payer: BC Managed Care – PPO | Admitting: Family Medicine

## 2023-06-03 DIAGNOSIS — E282 Polycystic ovarian syndrome: Secondary | ICD-10-CM | POA: Diagnosis not present

## 2023-06-03 NOTE — Patient Instructions (Addendum)
This week:  - Stock up on frozen veg's, so you can easily have a quick vegetable serving.   - Work on making vegetables with lunch and dinner a household norm, e.g., when having pizza, make it routine to include vegetables and/or fruit with your pizza.    Miscellaneous: - You used the word "extreme" today: Remember that all desirable  health behaviors lie in the area of moderation. While on vacation, stop to take an inventory of how you feel as well as what choices you prefer to make for yourself.   - Supplement recommendations:    - Wean yourself off fish oil (although feel free to aim for consuming fish at least once a week for dietary omega 3s).  Use of fish oil supplements has been associated with increased risk of atrial fibrillation.     - B vitamins are probably not needed if consuming a varied and nutritionally sound diet (although likely benign if you decide you prefer to use them).    - Calcium citrate at a dose of no more than 250 mg is warranted for you in light of your (a) low calcium intake energy intake; (b) energy intake; and (c) protein intake.  Ca citrate is best absorbed of the types of Ca supplements; does not matter if taken with or without food.  A good routine for Ca supplement is to take it at bedtime, especially since breakfast often includes other sources of Ca such as dairy foods or a MVM supplement.  I'm recommending low-dose Ca to reduce risk of poor cardiac outcomes associated with Ca supplements.     - Vitamin D: Supplement as needed to support a serum level of at least 20 ng/mL of 3-hydroxy vitamin D.  There is still controversy about ideal serum levels, but this level seems to be widely accepted as a reasonable target.  (D3 is believed to be better absorbed than D2.)   Goals:  1. Obtain at least 20 grams of protein per meal.   - 1 egg or 1 ounce of meat, fish, poultry, or cheese provides about 7 grams of protein.  - 8 oz of milk or 2 tbsp peanut butter provides 8 grams  of protein. - 1 cup (8 oz) of most beans provides 15 grams of protein.    - Yogurts vary greatly, but [place] yogurt is highest in protein.  - NOTE: The size of a deck of cards is equal to about 3-4 ounces of meat, fish, or poultry. - Higher-protein grains include quinoa, farro, and teff:  https://www.boyd-meyer.org/  2. Obtain at least 48 oz of water per day.  (Unsweetened tea counts.)    - Consider consuming at least 12 oz water BEFORE you walk in the morning and another 16 oz after walking.    3. Include a vegetable serving in at least 10 meals per week.      - Keep frozen vegetables on hand.    Email [name] with what method you'll use to document progress on above goals.    Follow-up: Video visit on Tuesday, August 13 at 11 AM.

## 2023-08-06 ENCOUNTER — Telehealth: Payer: BC Managed Care – PPO | Admitting: Family Medicine

## 2023-09-06 ENCOUNTER — Other Ambulatory Visit: Payer: Self-pay | Admitting: Obstetrics and Gynecology

## 2023-09-06 DIAGNOSIS — Z1231 Encounter for screening mammogram for malignant neoplasm of breast: Secondary | ICD-10-CM

## 2023-09-09 ENCOUNTER — Encounter: Payer: Self-pay | Admitting: Plastic Surgery

## 2023-09-09 ENCOUNTER — Other Ambulatory Visit: Payer: Self-pay | Admitting: Plastic Surgery

## 2023-09-09 DIAGNOSIS — R222 Localized swelling, mass and lump, trunk: Secondary | ICD-10-CM

## 2023-09-20 ENCOUNTER — Ambulatory Visit
Admission: RE | Admit: 2023-09-20 | Discharge: 2023-09-20 | Disposition: A | Discharge status: Home / Self Care | Payer: BC Managed Care – PPO | Source: Ambulatory Visit | Attending: Obstetrics and Gynecology | Admitting: Obstetrics and Gynecology

## 2023-09-20 DIAGNOSIS — Z1231 Encounter for screening mammogram for malignant neoplasm of breast: Secondary | ICD-10-CM

## 2023-09-25 ENCOUNTER — Ambulatory Visit
Admission: RE | Admit: 2023-09-25 | Discharge: 2023-09-25 | Disposition: A | Discharge status: Home / Self Care | Payer: BC Managed Care – PPO | Source: Ambulatory Visit | Attending: Plastic Surgery | Admitting: Plastic Surgery

## 2023-09-25 DIAGNOSIS — R222 Localized swelling, mass and lump, trunk: Secondary | ICD-10-CM

## 2023-10-14 ENCOUNTER — Ambulatory Visit: Payer: BC Managed Care – PPO | Admitting: Family Medicine

## 2023-10-14 DIAGNOSIS — E282 Polycystic ovarian syndrome: Secondary | ICD-10-CM | POA: Diagnosis not present

## 2023-10-14 NOTE — Progress Notes (Signed)
Telehealth Encounter  PCP: Crista Curb, PA at [facility] ([facility])  Therapist: Mike Craze, MA, [facility], [facility] I connected with Cynthia Mills (MRN [id number]) on 10/14/2023 by MyChart video-enabled, HIPAA-compliant telemedicine application, verified that I was speaking with the correct person using two identifiers, and that the patient was in a private environment conducive to confidentiality.  The patient agreed to proceed.  Persons participating in visit were patient and provider (registered dietitian) Cynthia Darner, PhD, RD, LDN, CEDRD.  Provider was located at [facility] during this telehealth encounter; patient was at work.  Appt start time: 1500 end time: 1600 (1 hour)  Reason for telehealth visit: Referred by therapist Mike Craze for Medical Nutrition Therapy related to weight management and PCOS.    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  Has "always" had elevated CRP.  She was seen by RD Danise Edge in 2018 for PCOS and A1c of 5.8 at that time.  Has seen RD Elio Forget extensively, exploring nutritional aspects of PCOS and body acceptance.  Cynthia Mills has been talking with Mike Craze about intuitive eating.  She started semaglutide Reginal Lutes) in Feb 2023, and has transitioned to Zepbound starting 10/13/23.    Assessment: Cynthia Mills took 2 weeks off from [place], and has not started Zepbound at 5 mg last night, with plans to titrate up.  She lost a total of >50 lb since starting Wegovy; no further wt loss since Mar/April 2024.  Has not noticed any significant increase in appetite or in amount of "food chatter" in her head.  Last A1c was 5.4% on 06/04/23, same as on 12/04/22.   Weight: Not reported today; (self-reported 215 lb on 06/03/23); ht is 62.5". Usual eating pattern:  3 meals and 0-1 snack per day.   Usual physical activity: Walking ~1 1/2 mi twice daily 6-7 X wk  and working out with personal trainer ~40 min 2 X wk.  Just completed 240-mi walking challenge at work.   Sleep: Estimates ~7 hrs/night.    Estimated progress on goals:  20 g protein/meal:  Meeting goal ~16 meals/wk, and still trying to incorporate protein foods to snacks.  48 oz water/day:  Meeting goal ~4 X wk, esp on workout days.   Veg >10 X wk:   ~7 X wk.    Documenting progress: Not documenting.    24-hr recall:  (Up at 8 AM; walked dogs, then went to church) B (10:30 AM)-  2 slc Raisin bread Fr toast, 1 tsp, butter, powdered sugar, 2 slc bacon, 1 tangerine, 1/2 banana, hot tea, diet creamer, Stevia Snk ( AM)-  --- L ( PM)-  --- Snk (2 PM)-  [place] cheese & nuts snack D (6 PM)-  4 oz filet mignon, med-small baked potato, salad, drsng, 1 fig newton Snk ( PM)-  1 c hot tea, diet creamer, Stevia Typical day? No.  Usually has both breakfast and lunch.    Intervention: Completed diet and exercise history, and modified behavioral goals.  For recommendations and goals, see Patient Instructions.    Follow-up: Telehealth appt in 6 weeks.   Laprecious Austill,[name]

## 2023-10-14 NOTE — Patient Instructions (Signed)
Behavioral Goals:  1. Obtain at least 20 grams of protein per meal.   - 1 egg or 1 ounce of meat, fish, poultry, or cheese provides about 7 grams of protein.  - 8 oz of milk or 2 tbsp peanut butter provides 8 grams of protein. - 1 cup (8 oz) of most beans provides 15 grams of protein.    - Yogurts vary greatly, but [place] yogurt is highest in protein.  - NOTE: The size of a deck of cards is equal to about 3-4 ounces of meat, fish, or poultry. - Higher-protein grains include quinoa, farro, and teff:  https://www.boyd-meyer.org/   2. Obtain at least 48 oz of water per day.  (Unsweetened tea counts.)     - Determine when the time of day that you best tolerate water, and aim for that time of day consistently.    3. Include a vegetable serving in at least 10 meals per week.      - Keep frozen vegetables on hand.     Document progress on your goals with the Goals Tracker provided today.    Follow-up: Video visit on Tuesday, December 3 at 9 AM.

## 2023-11-11 ENCOUNTER — Encounter: Payer: Self-pay | Admitting: Dermatology

## 2023-11-11 ENCOUNTER — Ambulatory Visit: Payer: BC Managed Care – PPO | Admitting: Dermatology

## 2023-11-11 DIAGNOSIS — L814 Other melanin hyperpigmentation: Secondary | ICD-10-CM

## 2023-11-11 DIAGNOSIS — L821 Other seborrheic keratosis: Secondary | ICD-10-CM

## 2023-11-11 DIAGNOSIS — L719 Rosacea, unspecified: Secondary | ICD-10-CM | POA: Diagnosis not present

## 2023-11-11 DIAGNOSIS — D2372 Other benign neoplasm of skin of left lower limb, including hip: Secondary | ICD-10-CM | POA: Diagnosis not present

## 2023-11-11 DIAGNOSIS — Z1283 Encounter for screening for malignant neoplasm of skin: Secondary | ICD-10-CM

## 2023-11-11 DIAGNOSIS — D2362 Other benign neoplasm of skin of left upper limb, including shoulder: Secondary | ICD-10-CM

## 2023-11-11 DIAGNOSIS — D2261 Melanocytic nevi of right upper limb, including shoulder: Secondary | ICD-10-CM

## 2023-11-11 DIAGNOSIS — D2371 Other benign neoplasm of skin of right lower limb, including hip: Secondary | ICD-10-CM

## 2023-11-11 DIAGNOSIS — L918 Other hypertrophic disorders of the skin: Secondary | ICD-10-CM | POA: Diagnosis not present

## 2023-11-11 DIAGNOSIS — D229 Melanocytic nevi, unspecified: Secondary | ICD-10-CM

## 2023-11-11 DIAGNOSIS — Z7189 Other specified counseling: Secondary | ICD-10-CM

## 2023-11-11 DIAGNOSIS — D1801 Hemangioma of skin and subcutaneous tissue: Secondary | ICD-10-CM

## 2023-11-11 DIAGNOSIS — W908XXA Exposure to other nonionizing radiation, initial encounter: Secondary | ICD-10-CM

## 2023-11-11 DIAGNOSIS — L578 Other skin changes due to chronic exposure to nonionizing radiation: Secondary | ICD-10-CM

## 2023-11-11 DIAGNOSIS — D239 Other benign neoplasm of skin, unspecified: Secondary | ICD-10-CM

## 2023-11-11 DIAGNOSIS — D2361 Other benign neoplasm of skin of right upper limb, including shoulder: Secondary | ICD-10-CM

## 2023-11-11 DIAGNOSIS — Z86018 Personal history of other benign neoplasm: Secondary | ICD-10-CM

## 2023-11-11 NOTE — Patient Instructions (Signed)

## 2023-11-11 NOTE — Progress Notes (Signed)
Follow-Up Visit   Subjective  Cynthia Mills is a 57 y.o. female who presents for the following: Skin Cancer Screening and Full Body Skin Exam, Rosacea, face, Azelaic Acid gel prn, Rhofade prn  The patient presents for Total-Body Skin Exam (TBSE) for skin cancer screening and mole check. The patient has spots, moles and lesions to be evaluated, some may be new or changing and the patient may have concern these could be cancer.    The following portions of the chart were reviewed this encounter and updated as appropriate: medications, allergies, medical history  Review of Systems:  No other skin or systemic complaints except as noted in HPI or Assessment and Plan.  Objective  Well appearing patient in no apparent distress; mood and affect are within normal limits.  A full examination was performed including scalp, head, eyes, ears, nose, lips, neck, chest, axillae, abdomen, back, buttocks, bilateral upper extremities, bilateral lower extremities, hands, feet, fingers, toes, fingernails, and toenails. All findings within normal limits unless otherwise noted below.   Relevant physical exam findings are noted in the Assessment and Plan.    Assessment & Plan   SKIN CANCER SCREENING PERFORMED TODAY.  ACTINIC DAMAGE - Chronic condition, secondary to cumulative UV/sun exposure - diffuse scaly erythematous macules with underlying dyspigmentation - Recommend daily broad spectrum sunscreen SPF 30+ to sun-exposed areas, reapply every 2 hours as needed.  - Staying in the shade or wearing long sleeves, sun glasses (UVA+UVB protection) and wide brim hats (4-inch brim around the entire circumference of the hat) are also recommended for sun protection.  - Call for new or changing lesions.  LENTIGINES, SEBORRHEIC KERATOSES, HEMANGIOMAS - Benign normal skin lesions - Benign-appearing - Call for any changes  MELANOCYTIC NEVI - Tan-brown and/or pink-flesh-colored symmetric macules and  papules - R upper arm 3 x 2 mm brown macule with notch - Benign appearing on exam today - Observation - Call clinic for new or changing moles - Recommend daily use of broad spectrum spf 30+ sunscreen to sun-exposed areas.    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  Observation.  Call clinic for new or changing moles.  Recommend daily use of broad spectrum spf 30+ sunscreen to sun-exposed areas.   Lipoma  Exam: Subcutaneous rubbery mass 10.0cm Location: R scapula  Benign-appearing. Exam most consistent with a lipoma. Discussed that a lipoma is a benign fatty growth that can grow over time and sometimes get irritated. Recommend observation if it is not bothersome or changing. Discussed option of ILK injections or surgical excision to remove it if it is growing, symptomatic, or other changes noted. Please call for new or changing lesions so they can be evaluated. Patient will schedule with Dr. Gwen Pounds for evaluation and possible excision.  BLUE NEVUS L lat foot Exam: 2.52mm blue macule  Treatment Plan: Benign-appearing. Stable compared to previous visit. Observation.  Call clinic for new or changing moles.  Recommend daily use of broad spectrum spf 30+ sunscreen to sun-exposed areas.   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 Condition is improving with treatment but not currently at goal.   Rosacea is a chronic progressive skin condition usually affecting the face of adults, causing redness and/or acne bumps. It is treatable but not curable. It sometimes affects the eyes (ocular rosacea) as well. It may respond to topical and/or systemic medication and  can flare with stress, sun exposure, alcohol, exercise, topical steroids (including hydrocortisone/cortisone 10) and some foods.  Daily application of broad spectrum spf 30+  sunscreen to face is recommended to reduce flares.  Patient denies grittiness of the eyes  Treatment Plan Cont Rhofade prn flares (pt will call for refills) Cont Azelaic Acid qd/bid prn flares (pt will call for refills) Discussed BBL to help with persistent redness  Counseling for BBL / IPL / Laser and Coordination of Care Discussed the treatment option of Broad Band Light (BBL) /Intense Pulsed Light (IPL)/ Laser for skin discoloration, including brown spots and redness.  Typically we recommend at least 1-3 treatment sessions about 5-8 weeks apart for best results.  Cannot have tanned skin when BBL performed, and regular use of sunscreen/photoprotection is advised after the procedure to help maintain results. The patient's condition may also require "maintenance treatments" in the future.  The fee for BBL / laser treatments is $350 per treatment session for the whole face.  A fee can be quoted for other parts of the body.  Insurance typically does not pay for BBL/laser treatments and therefore the fee is an out-of-pocket cost. Recommend prophylactic valtrex treatment. Once scheduled for procedure, will send Rx in prior to patient's appointment.    HISTORY OF DYSPLASTIC NEVUS No evidence of recurrence today Recommend regular full body skin exams Recommend daily broad spectrum sunscreen SPF 30+ to sun-exposed areas, reapply every 2 hours as needed.  Call if any new or changing lesions are noted between office visits  - R spinal mid upper back  DERMATOFIBROMA R wrist, L ant shoulder, L pretibia, R knee, R ankle Exam: Firm pink/brown papulenodule with dimple sign. Treatment Plan: A dermatofibroma is a benign growth possibly related to trauma, such as an insect bite, cut from shaving, or inflamed acne-type bump.  Treatment options to remove include shave or excision with resulting scar and risk of recurrence.  Since benign-appearing and not bothersome, will observe for now.    Acrochordons (Skin  Tags) R axilla, inguinal area, L neck - Fleshy, skin-colored pedunculated papules - Benign appearing.  - Observe. - If desired, they can be removed with an in office procedure that is not covered by insurance. Discussed cosmetic removal $115 for up to 15 - Please call the clinic if you notice any new or changing lesions.    Return in about 1 year (around 11/10/2024) for TBSE, Hx of Dysplastic nevi.  I, Ardis Rowan, RMA, am acting as scribe for Willeen Niece, MD .   Documentation: I have reviewed the above documentation for accuracy and completeness, and I agree with the above.  Willeen Niece, MD

## 2023-11-26 ENCOUNTER — Telehealth: Payer: BC Managed Care – PPO | Admitting: Family Medicine

## 2023-12-02 ENCOUNTER — Telehealth: Payer: BC Managed Care – PPO | Admitting: Family Medicine

## 2023-12-02 NOTE — Progress Notes (Unsigned)
Telehealth Encounter  PCP: Crista Curb, PA at [facility] ([facility])  Therapist: Mike Craze, MA, [facility], NCC I connected with Cynthia Mills (MRN [id number]) on 12/02/2023 by MyChart video-enabled, HIPAA-compliant telemedicine application, verified that I was speaking with the correct person using two identifiers, and that the patient was in a private environment conducive to confidentiality.  The patient agreed to proceed.  Persons participating in visit were patient and provider (registered dietitian) Linna Darner, PhD, RD, LDN, CEDRD.  Provider was located at [facility] during this telehealth encounter; patient was at work.  Appt start time: 0900 end time: 1000 (1 hour)  Reason for telehealth visit: Referred by therapist Mike Craze for Medical Nutrition Therapy related to weight management and PCOS.    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  Has "always" had elevated CRP.  She was seen by RD Danise Edge in 2018 for PCOS and A1c of 5.8 at that time.  Has seen RD Elio Forget extensively, exploring nutritional aspects of PCOS and body acceptance.  Cynthia Mills has been talking with Mike Craze about intuitive eating.  She started semaglutide Reginal Lutes) in Feb 2023, and has transitioned to Zepbound starting 10/13/23.    Assessment: Cynthia Mills is still taking Zepbound, current dose of *** mg.  ***.   Has not had A1c measured since 06/04/23 (5.4%), same as on 12/04/22.   Weight: Not reported today; (self-reported 215 lb on 06/03/23); ht is 62.5". Usual eating pattern:  3 meals and 0-1 snack per day.   Usual physical activity: Walking ***1 1/2 mi twice daily 6-7 X wk and working out with Systems analyst ***40 min 2 X wk.    Sleep: Estimates ~7 hrs/night.    Progress on goals:   Meeting goal:   20 g protein/meal:  ***16 meals/wk, and still trying to incorporate protein foods to  snacks.  48 oz water/day:     ***4 X wk, esp on workout days.   Veg >10 X wk:         ***7 X wk.    Documenting progress: Not documenting.    24-hr recall suggests intake of *** kcal:  (Up at  AM) B ( AM)-   Snk ( AM)-   L ( PM)-   Snk ( PM)-   D ( PM)-   Snk ( PM)-   Typical day? {yes [id number]}   Intervention: Completed diet and exercise history, and *** behavioral goals.  For recommendations and goals, see Patient Instructions.    Follow-up: Telehealth appt with [facility] prn.   SYKES,[name] I want to emphasize that good nutrition is not only about avoiding eating too much or the "wrong" foods; it is equally important to make sure you're getting enough.  There is a huge body of literature that demonstrates the benefits of a diet rich in fruits and veg's (e.g., a DASH diet provides 8-9 servings per day of F&V  per 2000 kcal).     From 10/21 *** Behavioral Goals:  1. Obtain at least 20 grams of protein per meal.   - 1 egg or 1 ounce of meat, fish, poultry, or cheese provides about 7 grams of protein.  - 8 oz of milk or 2 tbsp peanut butter provides 8 grams of protein. - 1 cup (8 oz) of most beans provides 15 grams of protein.    - Yogurts vary greatly, but [place] yogurt is highest in protein.  -  NOTE: The size of a deck of cards is equal to about 3-4 ounces of meat, fish, or poultry. - Higher-protein grains include quinoa, farro, and teff:  https://www.boyd-meyer.org/   2. Obtain at least 48 oz of water per day.  (Unsweetened tea counts.)     - Determine when the time of day that you best tolerate water, and aim for that time of day consistently.     3. Include a vegetable serving in at least 10 meals per week.      - Keep frozen vegetables on hand for a quick and easy option.     Document progress on your goals with the Goals Tracker provided today.    Follow-up: If you want to continue with a registered dietitian, I  recommend that you follow up with [place] at [facility], [address] ([address]) following my retirement.  They will call you to schedule an appt after receiving a physician referral.

## 2023-12-03 ENCOUNTER — Ambulatory Visit: Payer: BC Managed Care – PPO | Admitting: Family Medicine

## 2023-12-03 DIAGNOSIS — Z713 Dietary counseling and surveillance: Secondary | ICD-10-CM

## 2023-12-03 DIAGNOSIS — E282 Polycystic ovarian syndrome: Secondary | ICD-10-CM

## 2023-12-03 NOTE — Patient Instructions (Signed)
Suggestions:  - Consider that a factor in the diminished food chatter in your head may be the eating behaviors you've established over the past couple of years.  Your body may be better able to regulate appetite as your eating pattern has become more consistent and food composition has better met your nutritional needs.   - Remember our conversation today that appetite is affected by physical hunger, emotions, and habit.  You're likely to tame hunger by making sure you eat adequately in the morning and mid-day (including protein, high-quality starch, and veg's &/or fruit).  Habit is changed by establishing new routines around food (and exercise, which has its own appetite-modulating effects).  If you can better manage hunger and habit, you've conquered 2/3 of the challenge!   - In addition to obtaining 20 g protein at each meal, you may want to get back to having a protein snack right before bed, e.g., ~15 grams of protein, which at least some studies have shown may be beneficial to building/maintaining muscle.   - The best eating pattern for optimal metabolism is probably one that includes 3 meals and 1-2 snacks distributed over 10 or 11 hours.   - I want to emphasize that good nutrition is not only about avoiding eating too much or the "wrong" foods; it is equally important to make sure you're getting enough.  There is a huge body of literature that demonstrates the benefits of a diet rich in fruits and veg's (e.g., a DASH diet provides 8-9 servings per day of F&V per 2000 kcal).  This suggests prioritizing vegetables! - Lastly, pull these recommendations out from time to time, and re-read!  Behavioral Goals:  1. Obtain at least 20 grams of protein per meal.   - 1 egg or 1 ounce of meat, fish, poultry, or cheese provides about 7 grams of protein.  - 8 oz of milk or 2 tbsp peanut butter provides 8 grams of protein. - 1 cup (8 oz) of most beans provides 15 grams of protein.    - Yogurts vary greatly,  but [place] yogurt is highest in protein.  - NOTE: The size of a deck of cards is equal to about 3-4 ounces of meat, fish, or poultry. - Higher-protein grains include quinoa, farro, and teff:  https://www.boyd-meyer.org/  2. Obtain at least 48 oz of water per day.  (Unsweetened tea counts.)     - Determine when the time of day that you best tolerate water, and aim for that time of day consistently.    3. Include a vegetable serving in at least 10 meals per week.      - Keep frozen vegetables on hand for a quick and easy option.      - We're back in soup season; determine some soups you can make to provide not just veg's, but more fluid and protein to your diet.      - For a quick meal, use canned soup, but first microwave frozen veg's (and protein source?), add the soup, and re-heat.     Follow-up: If you want to continue with a registered dietitian, I recommend that you follow up with [facility] at [facility], [address] [place] (4th floor) following my retirement.  They will call you to schedule an appt after receiving a physician referral.  It has been an absolute pleasure to work with you, you are on a great health  trajectory, and I wish you continued success  in your journey.

## 2023-12-11 ENCOUNTER — Other Ambulatory Visit: Payer: Self-pay | Admitting: Internal Medicine

## 2023-12-11 DIAGNOSIS — G4733 Obstructive sleep apnea (adult) (pediatric): Secondary | ICD-10-CM

## 2024-02-03 ENCOUNTER — Ambulatory Visit
Admission: RE | Admit: 2024-02-03 | Discharge: 2024-02-03 | Disposition: A | Discharge status: Home / Self Care | Payer: Self-pay | Source: Ambulatory Visit | Attending: Internal Medicine | Admitting: Internal Medicine

## 2024-02-03 DIAGNOSIS — G4733 Obstructive sleep apnea (adult) (pediatric): Secondary | ICD-10-CM | POA: Insufficient documentation

## 2024-03-04 ENCOUNTER — Encounter: Payer: Self-pay | Admitting: Dermatology

## 2024-03-04 ENCOUNTER — Ambulatory Visit: Payer: BC Managed Care – PPO | Admitting: Dermatology

## 2024-03-04 DIAGNOSIS — Z7189 Other specified counseling: Secondary | ICD-10-CM

## 2024-03-04 DIAGNOSIS — D171 Benign lipomatous neoplasm of skin and subcutaneous tissue of trunk: Secondary | ICD-10-CM

## 2024-03-04 NOTE — Patient Instructions (Addendum)
 [facility] [id number]   Due to recent changes in healthcare laws, you may see results of your pathology and/or laboratory studies on MyChart before the doctors have had a chance to review them. We understand that in some cases there may be results that are confusing or concerning to you. Please understand that not all results are received at the same time and often the doctors may need to interpret multiple results in order to provide you with the best plan of care or course of treatment. Therefore, we ask that you please give [place] 2 business days to thoroughly review all your results before contacting the office for clarification. Should we see a critical lab result, you will be contacted sooner.   If You Need Anything After Your Visit  If you have any questions or concerns for your doctor, please call our main line at [phone] and press option 4 to reach your doctor's medical assistant. If no one answers, please leave a voicemail as directed and we will return your call as soon as possible. Messages left after 4 pm will be answered the following business day.   You may also send [place] a message via MyChart. We typically respond to MyChart messages within 1-2 business days.  For prescription refills, please ask your pharmacy to contact our office. Our fax number is [phone].  If you have an urgent issue when the clinic is closed that cannot wait until the next business day, you can page your doctor at the number below.    Please note that while we do our best to be available for urgent issues outside of office hours, we are not available 24/7.   If you have an urgent issue and are unable to reach [place], you may choose to seek medical care at your doctor's office, retail clinic, urgent care center, or emergency room.  If you have a medical emergency, please immediately call 911 or go to the emergency department.  Pager Numbers  - Dr. Gwen Pounds: [phone]  - Dr. Roseanne Reno:  [phone]  - Dr. Katrinka Blazing: [phone]   In the event of inclement weather, please call our main line at [phone] for an update on the status of any delays or closures.  Dermatology Medication Tips: Please keep the boxes that topical medications come in in order to help keep track of the instructions about where and how to use these. Pharmacies typically print the medication instructions only on the boxes and not directly on the medication tubes.   If your medication is too expensive, please contact our office at [phone] option 4 or send [place] a message through MyChart.   We are unable to tell what your co-pay for medications will be in advance as this is different depending on your insurance coverage. However, we may be able to find a substitute medication at lower cost or fill out paperwork to get insurance to cover a needed medication.   If a prior authorization is required to get your medication covered by your insurance company, please allow [place] 1-2 business days to complete this process.  Drug prices often vary depending on where the prescription is filled and some pharmacies may offer cheaper prices.  The website www.goodrx.com contains coupons for medications through different pharmacies. The prices here do not account for what the cost may be with help from insurance (it may be cheaper with your insurance), but the website can give you the price if you did not use any insurance.  - You can print the  associated coupon and take it with your prescription to the pharmacy.  - You may also stop by our office during regular business hours and pick up a GoodRx coupon card.  - If you need your prescription sent electronically to a different pharmacy, notify our office through [facility] or by phone at [phone] option 4.     Si Usted Necesita Algo Despus de Su Visita  Tambin puede enviarnos un mensaje a travs de Clinical cytogeneticist. Por lo general respondemos a los mensajes de  MyChart en el transcurso de 1 a 2 das hbiles.  Para renovar recetas, por favor pida a su farmacia que se ponga en contacto con nuestra oficina. Annie Sable de fax es [place] [phone].  Si tiene un asunto urgente cuando la clnica est cerrada y que no puede esperar hasta el siguiente da hbil, puede llamar/localizar a su doctor(a) al nmero que aparece a continuacin.   Por favor, tenga en cuenta que aunque hacemos todo lo posible para estar disponibles para asuntos urgentes fuera del horario de [place], no estamos disponibles las 24 horas del da, los 7 [address]  [address] de la [place].   Si tiene un problema urgente y no puede comunicarse con nosotros, puede optar por buscar atencin mdica  en el consultorio de su doctor(a), en una clnica privada, en un centro de atencin urgente o en una sala de emergencias.  Si tiene Engineer, drilling, por favor llame inmediatamente al 911 o vaya a la sala de emergencias.  Nmeros de bper  - Dr. Gwen Pounds: [phone]  - Dra. Roseanne Reno: [phone]  - Dr. Katrinka Blazing: [phone]   En caso de inclemencias del tiempo, por favor llame a Lacy Duverney principal al [phone] para una actualizacin sobre el [place] de cualquier retraso o cierre.  Consejos para la medicacin en dermatologa: Por favor, guarde las cajas en las que vienen los medicamentos de uso tpico para ayudarle a seguir las instrucciones sobre dnde y cmo usarlos. Las farmacias generalmente imprimen las instrucciones del medicamento slo en las cajas y no directamente en los tubos del [place].   Si su medicamento es muy caro, por favor, pngase en contacto con Rolm Gala llamando al [phone] y presione la opcin 4 o envenos un mensaje a travs de Clinical cytogeneticist.   No podemos decirle cul ser su copago por los medicamentos por adelantado ya que esto es diferente dependiendo de la cobertura de su seguro. Sin embargo, es posible que podamos encontrar un medicamento sustituto a Advice worker un formulario para que el seguro cubra el medicamento que se considera necesario.   Si se requiere una autorizacin previa para que su compaa de seguros [place] su medicamento, por favor permtanos de 1 a 2 das hbiles para completar [address]  Los precios de los medicamentos varan con frecuencia dependiendo del Environmental consultant de dnde se surte la receta y alguna farmacias pueden ofrecer precios ms baratos.  El sitio web www.goodrx.com tiene cupones para medicamentos de Health and safety inspector. Los precios aqu no tienen en cuenta lo que podra costar con la ayuda del seguro (puede ser ms barato con su seguro), pero el sitio web puede darle el precio si no utiliz Tourist information centre manager.  - Puede imprimir el cupn correspondiente y llevarlo con su receta a la farmacia.  - Tambin puede pasar por nuestra oficina durante el horario de atencin regular y Education officer, museum una tarjeta de cupones de GoodRx.  - Si necesita que su receta se enve electrnicamente a Psychiatrist, informe a nuestra oficina  a travs de MyChart de [place] o por telfono llamando al [phone] y presione la opcin 4.

## 2024-03-04 NOTE — Progress Notes (Signed)
   Follow-Up Visit   Subjective  Cynthia Mills is a 58 y.o. female who presents for the following: Lipoma back, has had for years, irritating when she works out, would like to discuss removal The patient has spots, moles and lesions to be evaluated, some may be new or changing and the patient may have concern these could be cancer.  The following portions of the chart were reviewed this encounter and updated as appropriate: medications, allergies, medical history  Review of Systems:  No other skin or systemic complaints except as noted in HPI or Assessment and Plan.  Objective  Well appearing patient in no apparent distress; mood and affect are within normal limits.  A focused examination was performed of the following areas: back Relevant exam findings are noted in the Assessment and Plan.   Assessment & Plan   Lipoma  R upper back Hx of MRI 09/25/2023 showed Lipoma Pt saw [facility] for evaluation in past Exam: Subcutaneous rubbery nodule(s) 14.0 x 11.0cm Location: R upper back  Benign-appearing. Exam most consistent with a lipoma. Discussed that a lipoma is a benign fatty growth that can grow over time and sometimes get irritated. Recommend observation if it is not bothersome or changing. Discussed option of ILK injections or surgical excision to remove it if it is growing, symptomatic, or other changes noted. Please call for new or changing lesions so they can be evaluated. Discussed risks with excision: recurrence, seroma; infection etc. Discussed Liposuction for removal Discussed if she would like removed can go back to Plastics at [facility], General Surgeon at [facility] or Liposuction referral to [facility]   Return for as scheduled with Dr. Roseanne Reno for TBSE, Hx of Dysplastic nevi.  I, Ardis Rowan, RMA, am acting as scribe for Armida Sans, MD .   Documentation: I have reviewed the above documentation for accuracy and completeness, and I  agree with the above.  Armida Sans, MD

## 2024-08-06 ENCOUNTER — Other Ambulatory Visit: Payer: Self-pay | Admitting: Physician Assistant

## 2024-08-06 DIAGNOSIS — Z1231 Encounter for screening mammogram for malignant neoplasm of breast: Secondary | ICD-10-CM

## 2024-09-21 ENCOUNTER — Ambulatory Visit
Admission: RE | Admit: 2024-09-21 | Discharge: 2024-09-21 | Disposition: A | Discharge status: Home / Self Care | Source: Ambulatory Visit | Attending: Physician Assistant | Admitting: Physician Assistant

## 2024-09-21 ENCOUNTER — Other Ambulatory Visit: Payer: Self-pay | Admitting: Medical Genetics

## 2024-09-21 DIAGNOSIS — Z1231 Encounter for screening mammogram for malignant neoplasm of breast: Secondary | ICD-10-CM

## 2024-11-23 ENCOUNTER — Ambulatory Visit: Payer: BC Managed Care – PPO | Admitting: Dermatology

## 2024-11-23 DIAGNOSIS — L578 Other skin changes due to chronic exposure to nonionizing radiation: Secondary | ICD-10-CM

## 2024-11-23 DIAGNOSIS — W908XXA Exposure to other nonionizing radiation, initial encounter: Secondary | ICD-10-CM

## 2024-11-23 DIAGNOSIS — L821 Other seborrheic keratosis: Secondary | ICD-10-CM | POA: Diagnosis not present

## 2024-11-23 DIAGNOSIS — D229 Melanocytic nevi, unspecified: Secondary | ICD-10-CM

## 2024-11-23 DIAGNOSIS — L719 Rosacea, unspecified: Secondary | ICD-10-CM

## 2024-11-23 DIAGNOSIS — Q825 Congenital non-neoplastic nevus: Secondary | ICD-10-CM

## 2024-11-23 DIAGNOSIS — Z86018 Personal history of other benign neoplasm: Secondary | ICD-10-CM

## 2024-11-23 DIAGNOSIS — Z1283 Encounter for screening for malignant neoplasm of skin: Secondary | ICD-10-CM | POA: Diagnosis not present

## 2024-11-23 DIAGNOSIS — L814 Other melanin hyperpigmentation: Secondary | ICD-10-CM

## 2024-11-23 DIAGNOSIS — D2362 Other benign neoplasm of skin of left upper limb, including shoulder: Secondary | ICD-10-CM

## 2024-11-23 DIAGNOSIS — D2361 Other benign neoplasm of skin of right upper limb, including shoulder: Secondary | ICD-10-CM

## 2024-11-23 DIAGNOSIS — D239 Other benign neoplasm of skin, unspecified: Secondary | ICD-10-CM

## 2024-11-23 DIAGNOSIS — D2372 Other benign neoplasm of skin of left lower limb, including hip: Secondary | ICD-10-CM

## 2024-11-23 DIAGNOSIS — L918 Other hypertrophic disorders of the skin: Secondary | ICD-10-CM

## 2024-11-23 DIAGNOSIS — D1801 Hemangioma of skin and subcutaneous tissue: Secondary | ICD-10-CM

## 2024-11-23 DIAGNOSIS — D2261 Melanocytic nevi of right upper limb, including shoulder: Secondary | ICD-10-CM

## 2024-11-23 DIAGNOSIS — D2371 Other benign neoplasm of skin of right lower limb, including hip: Secondary | ICD-10-CM

## 2024-11-23 MED ORDER — METRONIDAZOLE 0.75 % EX CREA: TOPICAL_CREAM | Freq: Two times a day (BID) | CUTANEOUS | 6 refills | Status: AC

## 2024-11-23 NOTE — Patient Instructions (Signed)

## 2024-11-23 NOTE — Progress Notes (Signed)
 Follow-Up Visit   Subjective  Cynthia Mills is a 58 y.o. female who presents for the following: Skin Cancer Screening and Full Body Skin Exam hx of Dysplastic Nevus, Rash on R neck, no symptoms  The patient presents for Total-Body Skin Exam (TBSE) for skin cancer screening and mole check. The patient has spots, moles and lesions to be evaluated, some may be new or changing and the patient may have concern these could be cancer.    The following portions of the chart were reviewed this encounter and updated as appropriate: medications, allergies, medical history  Review of Systems:  No other skin or systemic complaints except as noted in HPI or Assessment and Plan.  Objective  Well appearing patient in no apparent distress; mood and affect are within normal limits.  A full examination was performed including scalp, head, eyes, ears, nose, lips, neck, chest, axillae, abdomen, back, buttocks, bilateral upper extremities, bilateral lower extremities, hands, feet, fingers, toes, fingernails, and toenails. All findings within normal limits unless otherwise noted below.   Relevant physical exam findings are noted in the Assessment and Plan.  Right neck    Assessment & Plan   SKIN CANCER SCREENING PERFORMED TODAY.  ACTINIC DAMAGE - Chronic condition, secondary to cumulative UV/sun exposure - diffuse scaly erythematous macules with underlying dyspigmentation - Recommend daily broad spectrum sunscreen SPF 30+ to sun-exposed areas, reapply every 2 hours as needed.  - Staying in the shade or wearing long sleeves, sun glasses (UVA+UVB protection) and wide brim hats (4-inch brim around the entire circumference of the hat) are also recommended for sun protection.  - Call for new or changing lesions.  LENTIGINES, SEBORRHEIC KERATOSES, HEMANGIOMAS - Benign normal skin lesions - Benign-appearing - Call for any changes  LENTIGINES- face Counseling for BBL / IPL / Laser and  Coordination of Care Discussed the treatment option of Broad Band Light (BBL) /Intense Pulsed Light (IPL)/ Laser for skin discoloration, including brown spots and redness.  Typically we recommend at least 1-3 treatment sessions about 5-8 weeks apart for best results.  Cannot have tanned skin when BBL performed, and regular use of sunscreen/photoprotection is advised after the procedure to help maintain results. The patient's condition may also require maintenance treatments in the future.  The fee for BBL / laser treatments is $350 per treatment session for the whole face.  A fee can be quoted for other parts of the body.  Insurance typically does not pay for BBL/laser treatments and therefore the fee is an out-of-pocket cost. Recommend prophylactic valtrex treatment. Once scheduled for procedure, will send Rx in prior to patient's appointment.   MELANOCYTIC NEVI - Tan-brown and/or pink-flesh-colored symmetric macules and papules - R upper arm 3.0 x 2.0 mm brown macule with notch  - Benign appearing on exam today - Observation. Stable compared to previous visit.  - Call clinic for new or changing moles - Recommend daily use of broad spectrum spf 30+ sunscreen to sun-exposed areas.    CONGENITAL NEVUS R medial elbow Exam: 8 mm med brown thin papule   Treatment Plan: Benign-appearing.  Observation.  Call clinic for new or changing moles.  Recommend daily use of broad spectrum spf 30+ sunscreen to sun-exposed areas.   BLUE NEVUS L lat foot Exam: 2.33mm blue macule   Treatment Plan: Benign-appearing. Stable compared to previous visit. Observation.  Call clinic for new or changing moles.  Recommend daily use of broad spectrum spf 30+ sunscreen to sun-exposed areas.   ROSACEA Face,  neck Exam Mid face erythema with telangiectasias, focal area of small inflammatory paps and telangiectasias R ant neck, see photo  Chronic and persistent condition with duration or expected duration over one  year. Condition is symptomatic/ bothersome to patient. Not currently at goal.   Rosacea is a chronic progressive skin condition usually affecting the face of adults, causing redness and/or acne bumps. It is treatable but not curable. It sometimes affects the eyes (ocular rosacea) as well. It may respond to topical and/or systemic medication and can flare with stress, sun exposure, alcohol, exercise, topical steroids (including hydrocortisone/cortisone 10) and some foods.  Daily application of broad spectrum spf 30+ sunscreen to face is recommended to reduce flares.   Treatment Plan Cont Rhofade  qam prn  (pt will call for refills) Start Azelaic Acid  qd/bid prn flares to face and neck (pt will call for refills) Start Metronidazole 0.75% cr qd/bid to face and neck   DERMATOFIBROMA R wrist, L ant shoulder, L pretibia, R knee, R ankle Exam: Firm pink/brown papulenodule with dimple sign. Treatment Plan: A dermatofibroma is a benign growth possibly related to trauma, such as an insect bite, cut from shaving, or inflamed acne-type bump.  Treatment options to remove include shave or excision with resulting scar and risk of recurrence.  Since benign-appearing and not bothersome, will observe for now.  HISTORY OF DYSPLASTIC NEVUS No evidence of recurrence today- R spinal mid upper back Recommend regular full body skin exams Recommend daily broad spectrum sunscreen SPF 30+ to sun-exposed areas, reapply every 2 hours as needed.  Call if any new or changing lesions are noted between office visits    Acrochordons (Skin Tags) neck - Fleshy, skin-colored pedunculated papules - Benign appearing.  - Observe. - If desired, they can be removed with an in office procedure that is not covered by insurance. - Please call the clinic if you notice any new or changing lesions.    Return in about 1 year (around 11/23/2025) for TBSE, Hx of Dysplastic nevi.  I, Grayce Saunas, RMA, am acting as scribe for Rexene Rattler, MD .   Documentation: I have reviewed the above documentation for accuracy and completeness, and I agree with the above.  Rexene Rattler, MD

## 2024-11-23 NOTE — Progress Notes (Deleted)
   Follow-Up Visit   Subjective  Cynthia Mills is a 58 y.o. female who presents for the following: Skin Cancer Screening and Full Body Skin Exam Hx of rosacea,   The patient presents for Total-Body Skin Exam (TBSE) for skin cancer screening and mole check. The patient has spots, moles and lesions to be evaluated, some may be new or changing and the patient may have concern these could be cancer.    The following portions of the chart were reviewed this encounter and updated as appropriate: medications, allergies, medical history  Review of Systems:  No other skin or systemic complaints except as noted in HPI or Assessment and Plan.  Objective  Well appearing patient in no apparent distress; mood and affect are within normal limits.  A full examination was performed including scalp, head, eyes, ears, nose, lips, neck, chest, axillae, abdomen, back, buttocks, bilateral upper extremities, bilateral lower extremities, hands, feet, fingers, toes, fingernails, and toenails. All findings within normal limits unless otherwise noted below.   Relevant physical exam findings are noted in the Assessment and Plan.    Assessment & Plan   SKIN CANCER SCREENING PERFORMED TODAY.  ACTINIC DAMAGE - Chronic condition, secondary to cumulative UV/sun exposure - diffuse scaly erythematous macules with underlying dyspigmentation - Recommend daily broad spectrum sunscreen SPF 30+ to sun-exposed areas, reapply every 2 hours as needed.  - Staying in the shade or wearing long sleeves, sun glasses (UVA+UVB protection) and wide brim hats (4-inch brim around the entire circumference of the hat) are also recommended for sun protection.  - Call for new or changing lesions.  LENTIGINES, SEBORRHEIC KERATOSES, HEMANGIOMAS - Benign normal skin lesions - Benign-appearing - Call for any changes  MELANOCYTIC NEVI - Tan-brown and/or pink-flesh-colored symmetric macules and papules - Benign appearing on  exam today - Observation - Call clinic for new or changing moles - Recommend daily use of broad spectrum spf 30+ sunscreen to sun-exposed areas.     No follow-ups on file.  I, Eleanor Blush, CMA, am acting as scribe for Rexene Rattler, MD.   Documentation: I have reviewed the above documentation for accuracy and completeness, and I agree with the above.  Rexene Rattler, MD

## 2025-11-29 ENCOUNTER — Encounter: Admitting: Dermatology
